# Patient Record
Sex: Female | Born: 1985 | Race: White | Marital: Married | Smoking: Never smoker
Health system: Southern US, Community
[De-identification: ages and names within clinical notes are randomized; demographics above are authoritative.]

## PROBLEM LIST (undated history)

## (undated) DIAGNOSIS — E039 Hypothyroidism, unspecified: Secondary | ICD-10-CM

## (undated) DIAGNOSIS — G8929 Other chronic pain: Secondary | ICD-10-CM

## (undated) HISTORY — PX: WISDOM TOOTH EXTRACTION: SHX21

## (undated) HISTORY — DX: Hypothyroidism, unspecified: E03.9

## (undated) HISTORY — PX: BACK SURGERY: SHX140

## (undated) HISTORY — DX: Other chronic pain: G89.29

---

## 2020-02-05 DIAGNOSIS — M9902 Segmental and somatic dysfunction of thoracic region: Secondary | ICD-10-CM | POA: Diagnosis not present

## 2020-02-05 DIAGNOSIS — M545 Low back pain, unspecified: Secondary | ICD-10-CM | POA: Diagnosis not present

## 2020-02-05 DIAGNOSIS — M62830 Muscle spasm of back: Secondary | ICD-10-CM | POA: Diagnosis not present

## 2020-02-05 DIAGNOSIS — M9903 Segmental and somatic dysfunction of lumbar region: Secondary | ICD-10-CM | POA: Diagnosis not present

## 2020-02-12 DIAGNOSIS — M549 Dorsalgia, unspecified: Secondary | ICD-10-CM | POA: Diagnosis not present

## 2020-02-12 DIAGNOSIS — M5416 Radiculopathy, lumbar region: Secondary | ICD-10-CM | POA: Diagnosis not present

## 2020-02-12 DIAGNOSIS — M25552 Pain in left hip: Secondary | ICD-10-CM | POA: Diagnosis not present

## 2020-02-19 DIAGNOSIS — M5416 Radiculopathy, lumbar region: Secondary | ICD-10-CM | POA: Diagnosis not present

## 2020-02-19 DIAGNOSIS — M62830 Muscle spasm of back: Secondary | ICD-10-CM | POA: Diagnosis not present

## 2020-02-19 DIAGNOSIS — M5459 Other low back pain: Secondary | ICD-10-CM | POA: Diagnosis not present

## 2020-02-26 DIAGNOSIS — M545 Low back pain, unspecified: Secondary | ICD-10-CM | POA: Diagnosis not present

## 2020-02-26 DIAGNOSIS — M62830 Muscle spasm of back: Secondary | ICD-10-CM | POA: Diagnosis not present

## 2020-02-26 DIAGNOSIS — M9903 Segmental and somatic dysfunction of lumbar region: Secondary | ICD-10-CM | POA: Diagnosis not present

## 2020-02-26 DIAGNOSIS — M9902 Segmental and somatic dysfunction of thoracic region: Secondary | ICD-10-CM | POA: Diagnosis not present

## 2020-03-11 DIAGNOSIS — M5416 Radiculopathy, lumbar region: Secondary | ICD-10-CM | POA: Diagnosis not present

## 2020-03-14 DIAGNOSIS — M5416 Radiculopathy, lumbar region: Secondary | ICD-10-CM | POA: Diagnosis not present

## 2020-03-25 DIAGNOSIS — M62830 Muscle spasm of back: Secondary | ICD-10-CM | POA: Diagnosis not present

## 2020-03-25 DIAGNOSIS — M545 Low back pain, unspecified: Secondary | ICD-10-CM | POA: Diagnosis not present

## 2020-03-25 DIAGNOSIS — M9902 Segmental and somatic dysfunction of thoracic region: Secondary | ICD-10-CM | POA: Diagnosis not present

## 2020-03-25 DIAGNOSIS — M9903 Segmental and somatic dysfunction of lumbar region: Secondary | ICD-10-CM | POA: Diagnosis not present

## 2020-04-07 DIAGNOSIS — Z6828 Body mass index (BMI) 28.0-28.9, adult: Secondary | ICD-10-CM | POA: Diagnosis not present

## 2020-04-07 DIAGNOSIS — M5126 Other intervertebral disc displacement, lumbar region: Secondary | ICD-10-CM | POA: Diagnosis not present

## 2020-04-07 DIAGNOSIS — M5416 Radiculopathy, lumbar region: Secondary | ICD-10-CM | POA: Diagnosis not present

## 2020-04-07 DIAGNOSIS — R030 Elevated blood-pressure reading, without diagnosis of hypertension: Secondary | ICD-10-CM | POA: Diagnosis not present

## 2020-04-15 DIAGNOSIS — M9902 Segmental and somatic dysfunction of thoracic region: Secondary | ICD-10-CM | POA: Diagnosis not present

## 2020-04-15 DIAGNOSIS — M9903 Segmental and somatic dysfunction of lumbar region: Secondary | ICD-10-CM | POA: Diagnosis not present

## 2020-04-15 DIAGNOSIS — M62830 Muscle spasm of back: Secondary | ICD-10-CM | POA: Diagnosis not present

## 2020-04-15 DIAGNOSIS — M545 Low back pain, unspecified: Secondary | ICD-10-CM | POA: Diagnosis not present

## 2020-04-30 DIAGNOSIS — M5136 Other intervertebral disc degeneration, lumbar region: Secondary | ICD-10-CM | POA: Diagnosis not present

## 2020-04-30 DIAGNOSIS — M4727 Other spondylosis with radiculopathy, lumbosacral region: Secondary | ICD-10-CM | POA: Diagnosis not present

## 2020-04-30 DIAGNOSIS — M5126 Other intervertebral disc displacement, lumbar region: Secondary | ICD-10-CM | POA: Diagnosis not present

## 2020-04-30 DIAGNOSIS — M5117 Intervertebral disc disorders with radiculopathy, lumbosacral region: Secondary | ICD-10-CM | POA: Diagnosis not present

## 2020-04-30 DIAGNOSIS — M47816 Spondylosis without myelopathy or radiculopathy, lumbar region: Secondary | ICD-10-CM | POA: Diagnosis not present

## 2020-05-13 DIAGNOSIS — R635 Abnormal weight gain: Secondary | ICD-10-CM | POA: Diagnosis not present

## 2020-05-13 DIAGNOSIS — F419 Anxiety disorder, unspecified: Secondary | ICD-10-CM | POA: Diagnosis not present

## 2020-05-13 DIAGNOSIS — Z1331 Encounter for screening for depression: Secondary | ICD-10-CM | POA: Diagnosis not present

## 2020-05-13 DIAGNOSIS — Z1339 Encounter for screening examination for other mental health and behavioral disorders: Secondary | ICD-10-CM | POA: Diagnosis not present

## 2020-05-13 DIAGNOSIS — Z6829 Body mass index (BMI) 29.0-29.9, adult: Secondary | ICD-10-CM | POA: Diagnosis not present

## 2020-05-13 DIAGNOSIS — M5416 Radiculopathy, lumbar region: Secondary | ICD-10-CM | POA: Diagnosis not present

## 2020-05-13 DIAGNOSIS — G4700 Insomnia, unspecified: Secondary | ICD-10-CM | POA: Diagnosis not present

## 2020-05-13 DIAGNOSIS — Z1389 Encounter for screening for other disorder: Secondary | ICD-10-CM | POA: Diagnosis not present

## 2020-05-13 DIAGNOSIS — Z0000 Encounter for general adult medical examination without abnormal findings: Secondary | ICD-10-CM | POA: Diagnosis not present

## 2020-05-13 DIAGNOSIS — F32A Depression, unspecified: Secondary | ICD-10-CM | POA: Diagnosis not present

## 2020-05-13 DIAGNOSIS — E559 Vitamin D deficiency, unspecified: Secondary | ICD-10-CM | POA: Diagnosis not present

## 2020-08-12 DIAGNOSIS — E039 Hypothyroidism, unspecified: Secondary | ICD-10-CM | POA: Diagnosis not present

## 2020-08-12 DIAGNOSIS — F419 Anxiety disorder, unspecified: Secondary | ICD-10-CM | POA: Diagnosis not present

## 2020-08-12 DIAGNOSIS — M5416 Radiculopathy, lumbar region: Secondary | ICD-10-CM | POA: Diagnosis not present

## 2020-08-12 DIAGNOSIS — M549 Dorsalgia, unspecified: Secondary | ICD-10-CM | POA: Diagnosis not present

## 2020-08-12 DIAGNOSIS — E559 Vitamin D deficiency, unspecified: Secondary | ICD-10-CM | POA: Diagnosis not present

## 2020-10-09 DIAGNOSIS — M79641 Pain in right hand: Secondary | ICD-10-CM | POA: Diagnosis not present

## 2020-12-31 DIAGNOSIS — M9903 Segmental and somatic dysfunction of lumbar region: Secondary | ICD-10-CM | POA: Diagnosis not present

## 2020-12-31 DIAGNOSIS — M62830 Muscle spasm of back: Secondary | ICD-10-CM | POA: Diagnosis not present

## 2020-12-31 DIAGNOSIS — M545 Low back pain, unspecified: Secondary | ICD-10-CM | POA: Diagnosis not present

## 2020-12-31 DIAGNOSIS — M9902 Segmental and somatic dysfunction of thoracic region: Secondary | ICD-10-CM | POA: Diagnosis not present

## 2021-01-06 DIAGNOSIS — M9903 Segmental and somatic dysfunction of lumbar region: Secondary | ICD-10-CM | POA: Diagnosis not present

## 2021-01-06 DIAGNOSIS — M545 Low back pain, unspecified: Secondary | ICD-10-CM | POA: Diagnosis not present

## 2021-01-06 DIAGNOSIS — M9902 Segmental and somatic dysfunction of thoracic region: Secondary | ICD-10-CM | POA: Diagnosis not present

## 2021-01-06 DIAGNOSIS — M62830 Muscle spasm of back: Secondary | ICD-10-CM | POA: Diagnosis not present

## 2021-01-13 DIAGNOSIS — M62830 Muscle spasm of back: Secondary | ICD-10-CM | POA: Diagnosis not present

## 2021-01-13 DIAGNOSIS — M9903 Segmental and somatic dysfunction of lumbar region: Secondary | ICD-10-CM | POA: Diagnosis not present

## 2021-01-13 DIAGNOSIS — M545 Low back pain, unspecified: Secondary | ICD-10-CM | POA: Diagnosis not present

## 2021-01-13 DIAGNOSIS — M9902 Segmental and somatic dysfunction of thoracic region: Secondary | ICD-10-CM | POA: Diagnosis not present

## 2021-01-20 DIAGNOSIS — M9903 Segmental and somatic dysfunction of lumbar region: Secondary | ICD-10-CM | POA: Diagnosis not present

## 2021-01-20 DIAGNOSIS — M9902 Segmental and somatic dysfunction of thoracic region: Secondary | ICD-10-CM | POA: Diagnosis not present

## 2021-01-20 DIAGNOSIS — M545 Low back pain, unspecified: Secondary | ICD-10-CM | POA: Diagnosis not present

## 2021-01-20 DIAGNOSIS — M62830 Muscle spasm of back: Secondary | ICD-10-CM | POA: Diagnosis not present

## 2021-01-27 DIAGNOSIS — M545 Low back pain, unspecified: Secondary | ICD-10-CM | POA: Diagnosis not present

## 2021-01-27 DIAGNOSIS — M9903 Segmental and somatic dysfunction of lumbar region: Secondary | ICD-10-CM | POA: Diagnosis not present

## 2021-01-27 DIAGNOSIS — M9902 Segmental and somatic dysfunction of thoracic region: Secondary | ICD-10-CM | POA: Diagnosis not present

## 2021-01-27 DIAGNOSIS — M62830 Muscle spasm of back: Secondary | ICD-10-CM | POA: Diagnosis not present

## 2021-02-10 DIAGNOSIS — M62830 Muscle spasm of back: Secondary | ICD-10-CM | POA: Diagnosis not present

## 2021-02-10 DIAGNOSIS — M9902 Segmental and somatic dysfunction of thoracic region: Secondary | ICD-10-CM | POA: Diagnosis not present

## 2021-02-10 DIAGNOSIS — M9903 Segmental and somatic dysfunction of lumbar region: Secondary | ICD-10-CM | POA: Diagnosis not present

## 2021-02-10 DIAGNOSIS — M545 Low back pain, unspecified: Secondary | ICD-10-CM | POA: Diagnosis not present

## 2021-02-16 DIAGNOSIS — E039 Hypothyroidism, unspecified: Secondary | ICD-10-CM | POA: Diagnosis not present

## 2021-02-17 DIAGNOSIS — M9902 Segmental and somatic dysfunction of thoracic region: Secondary | ICD-10-CM | POA: Diagnosis not present

## 2021-02-17 DIAGNOSIS — M62830 Muscle spasm of back: Secondary | ICD-10-CM | POA: Diagnosis not present

## 2021-02-17 DIAGNOSIS — M9903 Segmental and somatic dysfunction of lumbar region: Secondary | ICD-10-CM | POA: Diagnosis not present

## 2021-02-17 DIAGNOSIS — M545 Low back pain, unspecified: Secondary | ICD-10-CM | POA: Diagnosis not present

## 2021-02-24 DIAGNOSIS — M9902 Segmental and somatic dysfunction of thoracic region: Secondary | ICD-10-CM | POA: Diagnosis not present

## 2021-02-24 DIAGNOSIS — M545 Low back pain, unspecified: Secondary | ICD-10-CM | POA: Diagnosis not present

## 2021-02-24 DIAGNOSIS — M9903 Segmental and somatic dysfunction of lumbar region: Secondary | ICD-10-CM | POA: Diagnosis not present

## 2021-02-24 DIAGNOSIS — M62830 Muscle spasm of back: Secondary | ICD-10-CM | POA: Diagnosis not present

## 2021-03-03 DIAGNOSIS — M545 Low back pain, unspecified: Secondary | ICD-10-CM | POA: Diagnosis not present

## 2021-03-03 DIAGNOSIS — M9903 Segmental and somatic dysfunction of lumbar region: Secondary | ICD-10-CM | POA: Diagnosis not present

## 2021-03-03 DIAGNOSIS — M9902 Segmental and somatic dysfunction of thoracic region: Secondary | ICD-10-CM | POA: Diagnosis not present

## 2021-03-03 DIAGNOSIS — M62830 Muscle spasm of back: Secondary | ICD-10-CM | POA: Diagnosis not present

## 2021-03-10 DIAGNOSIS — M9903 Segmental and somatic dysfunction of lumbar region: Secondary | ICD-10-CM | POA: Diagnosis not present

## 2021-03-10 DIAGNOSIS — M9902 Segmental and somatic dysfunction of thoracic region: Secondary | ICD-10-CM | POA: Diagnosis not present

## 2021-03-10 DIAGNOSIS — M545 Low back pain, unspecified: Secondary | ICD-10-CM | POA: Diagnosis not present

## 2021-03-10 DIAGNOSIS — M62830 Muscle spasm of back: Secondary | ICD-10-CM | POA: Diagnosis not present

## 2021-03-17 DIAGNOSIS — M9903 Segmental and somatic dysfunction of lumbar region: Secondary | ICD-10-CM | POA: Diagnosis not present

## 2021-03-17 DIAGNOSIS — M9902 Segmental and somatic dysfunction of thoracic region: Secondary | ICD-10-CM | POA: Diagnosis not present

## 2021-03-17 DIAGNOSIS — M545 Low back pain, unspecified: Secondary | ICD-10-CM | POA: Diagnosis not present

## 2021-03-17 DIAGNOSIS — M62830 Muscle spasm of back: Secondary | ICD-10-CM | POA: Diagnosis not present

## 2021-03-19 ENCOUNTER — Other Ambulatory Visit: Payer: Self-pay | Admitting: Neurosurgery

## 2021-03-19 DIAGNOSIS — M5416 Radiculopathy, lumbar region: Secondary | ICD-10-CM

## 2021-03-24 DIAGNOSIS — M545 Low back pain, unspecified: Secondary | ICD-10-CM | POA: Diagnosis not present

## 2021-03-24 DIAGNOSIS — M9903 Segmental and somatic dysfunction of lumbar region: Secondary | ICD-10-CM | POA: Diagnosis not present

## 2021-03-24 DIAGNOSIS — M9902 Segmental and somatic dysfunction of thoracic region: Secondary | ICD-10-CM | POA: Diagnosis not present

## 2021-03-24 DIAGNOSIS — M62830 Muscle spasm of back: Secondary | ICD-10-CM | POA: Diagnosis not present

## 2021-03-30 DIAGNOSIS — R030 Elevated blood-pressure reading, without diagnosis of hypertension: Secondary | ICD-10-CM | POA: Diagnosis not present

## 2021-03-30 DIAGNOSIS — M5416 Radiculopathy, lumbar region: Secondary | ICD-10-CM | POA: Diagnosis not present

## 2021-04-03 ENCOUNTER — Ambulatory Visit
Admission: RE | Admit: 2021-04-03 | Discharge: 2021-04-03 | Disposition: A | Discharge status: Home / Self Care | Payer: Federal, State, Local not specified - PPO | Source: Ambulatory Visit | Attending: Neurosurgery | Admitting: Neurosurgery

## 2021-04-03 ENCOUNTER — Other Ambulatory Visit: Payer: Self-pay

## 2021-04-03 DIAGNOSIS — R600 Localized edema: Secondary | ICD-10-CM | POA: Diagnosis not present

## 2021-04-03 DIAGNOSIS — M5126 Other intervertebral disc displacement, lumbar region: Secondary | ICD-10-CM | POA: Diagnosis not present

## 2021-04-03 DIAGNOSIS — M47817 Spondylosis without myelopathy or radiculopathy, lumbosacral region: Secondary | ICD-10-CM | POA: Diagnosis not present

## 2021-04-03 DIAGNOSIS — M5416 Radiculopathy, lumbar region: Secondary | ICD-10-CM

## 2021-04-03 DIAGNOSIS — M4319 Spondylolisthesis, multiple sites in spine: Secondary | ICD-10-CM | POA: Diagnosis not present

## 2021-04-03 MED ORDER — GADOBENATE DIMEGLUMINE 529 MG/ML IV SOLN
15.0000 mL | Freq: Once | INTRAVENOUS | Status: AC | PRN
  Administered 2021-04-03: 15 mL via INTRAVENOUS

## 2021-04-08 ENCOUNTER — Other Ambulatory Visit: Payer: Self-pay

## 2021-04-09 DIAGNOSIS — M5416 Radiculopathy, lumbar region: Secondary | ICD-10-CM | POA: Diagnosis not present

## 2021-04-15 DIAGNOSIS — Z319 Encounter for procreative management, unspecified: Secondary | ICD-10-CM | POA: Diagnosis not present

## 2021-04-15 DIAGNOSIS — N979 Female infertility, unspecified: Secondary | ICD-10-CM | POA: Diagnosis not present

## 2021-04-15 DIAGNOSIS — Z3169 Encounter for other general counseling and advice on procreation: Secondary | ICD-10-CM | POA: Diagnosis not present

## 2021-04-24 DIAGNOSIS — N979 Female infertility, unspecified: Secondary | ICD-10-CM | POA: Diagnosis not present

## 2021-04-24 DIAGNOSIS — Z3202 Encounter for pregnancy test, result negative: Secondary | ICD-10-CM | POA: Diagnosis not present

## 2021-04-24 DIAGNOSIS — Z3169 Encounter for other general counseling and advice on procreation: Secondary | ICD-10-CM | POA: Diagnosis not present

## 2021-05-03 NOTE — L&D Delivery Note (Signed)
Delivery Note At 8:41 PM a viable and healthy female was delivered via Vaginal, Spontaneous (Presentation: Right Occiput Anterior).  APGAR: 8, 9; weight pending .   Placenta status: Spontaneous, Intact.  Cord: 3 vessels with the following complications: None.  Cord pH: N/A  Anesthesia: Epidural Episiotomy: None Lacerations: 2nd degree Suture Repair: 3.0 vicryl Est. Blood Loss (mL):  40 cc  Mom to postpartum.  Baby to Couplet care / Skin to Skin.  Jasmine Moreno [name] 01/27/2022, 9:13 PM

## 2021-05-12 DIAGNOSIS — M62830 Muscle spasm of back: Secondary | ICD-10-CM | POA: Diagnosis not present

## 2021-05-12 DIAGNOSIS — M545 Low back pain, unspecified: Secondary | ICD-10-CM | POA: Diagnosis not present

## 2021-05-12 DIAGNOSIS — M9903 Segmental and somatic dysfunction of lumbar region: Secondary | ICD-10-CM | POA: Diagnosis not present

## 2021-05-12 DIAGNOSIS — M9902 Segmental and somatic dysfunction of thoracic region: Secondary | ICD-10-CM | POA: Diagnosis not present

## 2021-05-26 DIAGNOSIS — M47816 Spondylosis without myelopathy or radiculopathy, lumbar region: Secondary | ICD-10-CM | POA: Diagnosis not present

## 2021-06-02 DIAGNOSIS — M545 Low back pain, unspecified: Secondary | ICD-10-CM | POA: Diagnosis not present

## 2021-06-02 DIAGNOSIS — M9903 Segmental and somatic dysfunction of lumbar region: Secondary | ICD-10-CM | POA: Diagnosis not present

## 2021-06-02 DIAGNOSIS — M9902 Segmental and somatic dysfunction of thoracic region: Secondary | ICD-10-CM | POA: Diagnosis not present

## 2021-06-02 DIAGNOSIS — M62830 Muscle spasm of back: Secondary | ICD-10-CM | POA: Diagnosis not present

## 2021-06-10 DIAGNOSIS — M47816 Spondylosis without myelopathy or radiculopathy, lumbar region: Secondary | ICD-10-CM | POA: Diagnosis not present

## 2021-06-18 DIAGNOSIS — M9902 Segmental and somatic dysfunction of thoracic region: Secondary | ICD-10-CM | POA: Diagnosis not present

## 2021-06-18 DIAGNOSIS — M62830 Muscle spasm of back: Secondary | ICD-10-CM | POA: Diagnosis not present

## 2021-06-18 DIAGNOSIS — M9903 Segmental and somatic dysfunction of lumbar region: Secondary | ICD-10-CM | POA: Diagnosis not present

## 2021-06-18 DIAGNOSIS — M545 Low back pain, unspecified: Secondary | ICD-10-CM | POA: Diagnosis not present

## 2021-07-03 DIAGNOSIS — Z3689 Encounter for other specified antenatal screening: Secondary | ICD-10-CM | POA: Diagnosis not present

## 2021-07-03 DIAGNOSIS — Z3200 Encounter for pregnancy test, result unknown: Secondary | ICD-10-CM | POA: Diagnosis not present

## 2021-07-08 DIAGNOSIS — Z3201 Encounter for pregnancy test, result positive: Secondary | ICD-10-CM | POA: Diagnosis not present

## 2021-07-16 DIAGNOSIS — M62830 Muscle spasm of back: Secondary | ICD-10-CM | POA: Diagnosis not present

## 2021-07-16 DIAGNOSIS — M9902 Segmental and somatic dysfunction of thoracic region: Secondary | ICD-10-CM | POA: Diagnosis not present

## 2021-07-16 DIAGNOSIS — M545 Low back pain, unspecified: Secondary | ICD-10-CM | POA: Diagnosis not present

## 2021-07-16 DIAGNOSIS — M9903 Segmental and somatic dysfunction of lumbar region: Secondary | ICD-10-CM | POA: Diagnosis not present

## 2021-07-24 DIAGNOSIS — O99280 Endocrine, nutritional and metabolic diseases complicating pregnancy, unspecified trimester: Secondary | ICD-10-CM | POA: Diagnosis not present

## 2021-07-24 DIAGNOSIS — Z3689 Encounter for other specified antenatal screening: Secondary | ICD-10-CM | POA: Diagnosis not present

## 2021-07-24 DIAGNOSIS — Z01419 Encounter for gynecological examination (general) (routine) without abnormal findings: Secondary | ICD-10-CM | POA: Diagnosis not present

## 2021-07-24 DIAGNOSIS — Z3A10 10 weeks gestation of pregnancy: Secondary | ICD-10-CM | POA: Diagnosis not present

## 2021-07-24 DIAGNOSIS — Z113 Encounter for screening for infections with a predominantly sexual mode of transmission: Secondary | ICD-10-CM | POA: Diagnosis not present

## 2021-07-24 DIAGNOSIS — O09511 Supervision of elderly primigravida, first trimester: Secondary | ICD-10-CM | POA: Diagnosis not present

## 2021-07-24 DIAGNOSIS — Z01411 Encounter for gynecological examination (general) (routine) with abnormal findings: Secondary | ICD-10-CM | POA: Diagnosis not present

## 2021-07-24 DIAGNOSIS — Z124 Encounter for screening for malignant neoplasm of cervix: Secondary | ICD-10-CM | POA: Diagnosis not present

## 2021-07-24 LAB — OB RESULTS CONSOLE RUBELLA ANTIBODY, IGM: Rubella: IMMUNE

## 2021-07-24 LAB — OB RESULTS CONSOLE HIV ANTIBODY (ROUTINE TESTING): HIV: NONREACTIVE

## 2021-07-24 LAB — OB RESULTS CONSOLE GC/CHLAMYDIA
Chlamydia: NEGATIVE
Neisseria Gonorrhea: NEGATIVE

## 2021-07-24 LAB — HEPATITIS C ANTIBODY: HCV Ab: NEGATIVE

## 2021-07-24 LAB — OB RESULTS CONSOLE RPR: RPR: NONREACTIVE

## 2021-07-24 LAB — OB RESULTS CONSOLE HEPATITIS B SURFACE ANTIGEN: Hepatitis B Surface Ag: NEGATIVE

## 2021-07-27 DIAGNOSIS — Z6824 Body mass index (BMI) 24.0-24.9, adult: Secondary | ICD-10-CM | POA: Diagnosis not present

## 2021-07-27 DIAGNOSIS — Z0000 Encounter for general adult medical examination without abnormal findings: Secondary | ICD-10-CM | POA: Diagnosis not present

## 2021-07-27 DIAGNOSIS — E039 Hypothyroidism, unspecified: Secondary | ICD-10-CM | POA: Diagnosis not present

## 2021-07-27 DIAGNOSIS — M5416 Radiculopathy, lumbar region: Secondary | ICD-10-CM | POA: Diagnosis not present

## 2021-08-13 DIAGNOSIS — M9903 Segmental and somatic dysfunction of lumbar region: Secondary | ICD-10-CM | POA: Diagnosis not present

## 2021-08-13 DIAGNOSIS — M9902 Segmental and somatic dysfunction of thoracic region: Secondary | ICD-10-CM | POA: Diagnosis not present

## 2021-08-13 DIAGNOSIS — M545 Low back pain, unspecified: Secondary | ICD-10-CM | POA: Diagnosis not present

## 2021-08-13 DIAGNOSIS — M62830 Muscle spasm of back: Secondary | ICD-10-CM | POA: Diagnosis not present

## 2021-09-17 DIAGNOSIS — M62830 Muscle spasm of back: Secondary | ICD-10-CM | POA: Diagnosis not present

## 2021-09-17 DIAGNOSIS — M9903 Segmental and somatic dysfunction of lumbar region: Secondary | ICD-10-CM | POA: Diagnosis not present

## 2021-09-17 DIAGNOSIS — M545 Low back pain, unspecified: Secondary | ICD-10-CM | POA: Diagnosis not present

## 2021-09-17 DIAGNOSIS — M9902 Segmental and somatic dysfunction of thoracic region: Secondary | ICD-10-CM | POA: Diagnosis not present

## 2021-09-25 DIAGNOSIS — O09511 Supervision of elderly primigravida, first trimester: Secondary | ICD-10-CM | POA: Diagnosis not present

## 2021-09-25 DIAGNOSIS — Z3689 Encounter for other specified antenatal screening: Secondary | ICD-10-CM | POA: Diagnosis not present

## 2021-09-25 DIAGNOSIS — Z3A19 19 weeks gestation of pregnancy: Secondary | ICD-10-CM | POA: Diagnosis not present

## 2021-09-25 DIAGNOSIS — Z361 Encounter for antenatal screening for raised alphafetoprotein level: Secondary | ICD-10-CM | POA: Diagnosis not present

## 2021-09-25 DIAGNOSIS — O09522 Supervision of elderly multigravida, second trimester: Secondary | ICD-10-CM | POA: Diagnosis not present

## 2021-09-25 DIAGNOSIS — Z3A16 16 weeks gestation of pregnancy: Secondary | ICD-10-CM | POA: Diagnosis not present

## 2021-09-25 DIAGNOSIS — O99280 Endocrine, nutritional and metabolic diseases complicating pregnancy, unspecified trimester: Secondary | ICD-10-CM | POA: Diagnosis not present

## 2021-10-15 DIAGNOSIS — M62830 Muscle spasm of back: Secondary | ICD-10-CM | POA: Diagnosis not present

## 2021-10-15 DIAGNOSIS — M9903 Segmental and somatic dysfunction of lumbar region: Secondary | ICD-10-CM | POA: Diagnosis not present

## 2021-10-15 DIAGNOSIS — M545 Low back pain, unspecified: Secondary | ICD-10-CM | POA: Diagnosis not present

## 2021-10-15 DIAGNOSIS — M9902 Segmental and somatic dysfunction of thoracic region: Secondary | ICD-10-CM | POA: Diagnosis not present

## 2021-11-19 DIAGNOSIS — M9902 Segmental and somatic dysfunction of thoracic region: Secondary | ICD-10-CM | POA: Diagnosis not present

## 2021-11-19 DIAGNOSIS — M9903 Segmental and somatic dysfunction of lumbar region: Secondary | ICD-10-CM | POA: Diagnosis not present

## 2021-11-19 DIAGNOSIS — M62830 Muscle spasm of back: Secondary | ICD-10-CM | POA: Diagnosis not present

## 2021-11-19 DIAGNOSIS — M545 Low back pain, unspecified: Secondary | ICD-10-CM | POA: Diagnosis not present

## 2021-11-20 DIAGNOSIS — O99280 Endocrine, nutritional and metabolic diseases complicating pregnancy, unspecified trimester: Secondary | ICD-10-CM | POA: Diagnosis not present

## 2021-11-20 DIAGNOSIS — Z3A27 27 weeks gestation of pregnancy: Secondary | ICD-10-CM | POA: Diagnosis not present

## 2021-11-20 DIAGNOSIS — Z3689 Encounter for other specified antenatal screening: Secondary | ICD-10-CM | POA: Diagnosis not present

## 2021-11-20 DIAGNOSIS — Z3493 Encounter for supervision of normal pregnancy, unspecified, third trimester: Secondary | ICD-10-CM | POA: Diagnosis not present

## 2021-11-20 LAB — OB RESULTS CONSOLE ABO/RH: RH Type: NEGATIVE

## 2021-11-20 LAB — OB RESULTS CONSOLE ANTIBODY SCREEN: Antibody Screen: NEGATIVE

## 2021-11-26 DIAGNOSIS — O360120 Maternal care for anti-D [Rh] antibodies, second trimester, not applicable or unspecified: Secondary | ICD-10-CM | POA: Diagnosis not present

## 2021-11-26 DIAGNOSIS — O99810 Abnormal glucose complicating pregnancy: Secondary | ICD-10-CM | POA: Diagnosis not present

## 2021-11-26 DIAGNOSIS — Z3A28 28 weeks gestation of pregnancy: Secondary | ICD-10-CM | POA: Diagnosis not present

## 2021-11-26 DIAGNOSIS — O360121 Maternal care for anti-D [Rh] antibodies, second trimester, fetus 1: Secondary | ICD-10-CM | POA: Diagnosis not present

## 2021-11-26 DIAGNOSIS — Z3689 Encounter for other specified antenatal screening: Secondary | ICD-10-CM | POA: Diagnosis not present

## 2021-12-04 DIAGNOSIS — O43123 Velamentous insertion of umbilical cord, third trimester: Secondary | ICD-10-CM | POA: Diagnosis not present

## 2021-12-04 DIAGNOSIS — Z3A29 29 weeks gestation of pregnancy: Secondary | ICD-10-CM | POA: Diagnosis not present

## 2021-12-09 DIAGNOSIS — O365930 Maternal care for other known or suspected poor fetal growth, third trimester, not applicable or unspecified: Secondary | ICD-10-CM | POA: Diagnosis not present

## 2021-12-09 DIAGNOSIS — Z3A30 30 weeks gestation of pregnancy: Secondary | ICD-10-CM | POA: Diagnosis not present

## 2021-12-09 DIAGNOSIS — O43123 Velamentous insertion of umbilical cord, third trimester: Secondary | ICD-10-CM | POA: Diagnosis not present

## 2021-12-09 DIAGNOSIS — O99283 Endocrine, nutritional and metabolic diseases complicating pregnancy, third trimester: Secondary | ICD-10-CM | POA: Diagnosis not present

## 2021-12-17 DIAGNOSIS — M9903 Segmental and somatic dysfunction of lumbar region: Secondary | ICD-10-CM | POA: Diagnosis not present

## 2021-12-17 DIAGNOSIS — M9902 Segmental and somatic dysfunction of thoracic region: Secondary | ICD-10-CM | POA: Diagnosis not present

## 2021-12-17 DIAGNOSIS — M545 Low back pain, unspecified: Secondary | ICD-10-CM | POA: Diagnosis not present

## 2021-12-17 DIAGNOSIS — M62830 Muscle spasm of back: Secondary | ICD-10-CM | POA: Diagnosis not present

## 2021-12-18 DIAGNOSIS — O43123 Velamentous insertion of umbilical cord, third trimester: Secondary | ICD-10-CM | POA: Diagnosis not present

## 2021-12-18 DIAGNOSIS — Z3A30 30 weeks gestation of pregnancy: Secondary | ICD-10-CM | POA: Diagnosis not present

## 2021-12-18 DIAGNOSIS — O365930 Maternal care for other known or suspected poor fetal growth, third trimester, not applicable or unspecified: Secondary | ICD-10-CM | POA: Diagnosis not present

## 2021-12-18 DIAGNOSIS — O99283 Endocrine, nutritional and metabolic diseases complicating pregnancy, third trimester: Secondary | ICD-10-CM | POA: Diagnosis not present

## 2021-12-18 DIAGNOSIS — Z3A31 31 weeks gestation of pregnancy: Secondary | ICD-10-CM | POA: Diagnosis not present

## 2021-12-22 DIAGNOSIS — O365930 Maternal care for other known or suspected poor fetal growth, third trimester, not applicable or unspecified: Secondary | ICD-10-CM | POA: Diagnosis not present

## 2021-12-22 DIAGNOSIS — Z3A32 32 weeks gestation of pregnancy: Secondary | ICD-10-CM | POA: Diagnosis not present

## 2021-12-25 DIAGNOSIS — O365930 Maternal care for other known or suspected poor fetal growth, third trimester, not applicable or unspecified: Secondary | ICD-10-CM | POA: Diagnosis not present

## 2021-12-25 DIAGNOSIS — O09513 Supervision of elderly primigravida, third trimester: Secondary | ICD-10-CM | POA: Diagnosis not present

## 2021-12-25 DIAGNOSIS — O99283 Endocrine, nutritional and metabolic diseases complicating pregnancy, third trimester: Secondary | ICD-10-CM | POA: Diagnosis not present

## 2021-12-25 DIAGNOSIS — O283 Abnormal ultrasonic finding on antenatal screening of mother: Secondary | ICD-10-CM | POA: Diagnosis not present

## 2021-12-25 DIAGNOSIS — Z3A32 32 weeks gestation of pregnancy: Secondary | ICD-10-CM | POA: Diagnosis not present

## 2021-12-25 DIAGNOSIS — O43123 Velamentous insertion of umbilical cord, third trimester: Secondary | ICD-10-CM | POA: Diagnosis not present

## 2021-12-29 ENCOUNTER — Other Ambulatory Visit: Payer: Self-pay | Admitting: Obstetrics and Gynecology

## 2021-12-29 DIAGNOSIS — Z363 Encounter for antenatal screening for malformations: Secondary | ICD-10-CM

## 2021-12-29 DIAGNOSIS — Z3A33 33 weeks gestation of pregnancy: Secondary | ICD-10-CM | POA: Diagnosis not present

## 2021-12-29 DIAGNOSIS — O365930 Maternal care for other known or suspected poor fetal growth, third trimester, not applicable or unspecified: Secondary | ICD-10-CM | POA: Diagnosis not present

## 2021-12-29 DIAGNOSIS — E039 Hypothyroidism, unspecified: Secondary | ICD-10-CM

## 2021-12-29 DIAGNOSIS — O09523 Supervision of elderly multigravida, third trimester: Secondary | ICD-10-CM

## 2022-01-01 ENCOUNTER — Encounter: Payer: Self-pay | Admitting: *Deleted

## 2022-01-01 DIAGNOSIS — O365930 Maternal care for other known or suspected poor fetal growth, third trimester, not applicable or unspecified: Secondary | ICD-10-CM | POA: Diagnosis not present

## 2022-01-05 ENCOUNTER — Ambulatory Visit: Payer: Federal, State, Local not specified - PPO | Admitting: *Deleted

## 2022-01-05 ENCOUNTER — Other Ambulatory Visit: Payer: Self-pay | Admitting: Obstetrics and Gynecology

## 2022-01-05 ENCOUNTER — Encounter: Payer: Self-pay | Admitting: *Deleted

## 2022-01-05 ENCOUNTER — Ambulatory Visit (HOSPITAL_BASED_OUTPATIENT_CLINIC_OR_DEPARTMENT_OTHER): Payer: Federal, State, Local not specified - PPO | Admitting: Obstetrics

## 2022-01-05 ENCOUNTER — Ambulatory Visit: Payer: Federal, State, Local not specified - PPO | Attending: Obstetrics and Gynecology

## 2022-01-05 DIAGNOSIS — O365930 Maternal care for other known or suspected poor fetal growth, third trimester, not applicable or unspecified: Secondary | ICD-10-CM

## 2022-01-05 DIAGNOSIS — O99283 Endocrine, nutritional and metabolic diseases complicating pregnancy, third trimester: Secondary | ICD-10-CM | POA: Insufficient documentation

## 2022-01-05 DIAGNOSIS — Z363 Encounter for antenatal screening for malformations: Secondary | ICD-10-CM | POA: Diagnosis not present

## 2022-01-05 DIAGNOSIS — E039 Hypothyroidism, unspecified: Secondary | ICD-10-CM

## 2022-01-05 DIAGNOSIS — O09523 Supervision of elderly multigravida, third trimester: Secondary | ICD-10-CM | POA: Diagnosis not present

## 2022-01-05 DIAGNOSIS — Z3A34 34 weeks gestation of pregnancy: Secondary | ICD-10-CM

## 2022-01-05 DIAGNOSIS — O365990 Maternal care for other known or suspected poor fetal growth, unspecified trimester, not applicable or unspecified: Secondary | ICD-10-CM

## 2022-01-05 DIAGNOSIS — O09513 Supervision of elderly primigravida, third trimester: Secondary | ICD-10-CM | POA: Insufficient documentation

## 2022-01-05 NOTE — Procedures (Signed)
Jasmine Moreno [birth year] [redacted]w[redacted]d  Fetus A Non-Stress Test Interpretation for 01/05/22  Indication: IUGR  Fetal Heart Rate A Mode: External Baseline Rate (A): 130 bpm Variability: Moderate Accelerations: 15 x 15 Decelerations: None Multiple birth?: No  Uterine Activity Mode: Palpation, Toco Contraction Frequency (min): none Resting Tone Palpated: Relaxed  Interpretation (Fetal Testing) Nonstress Test Interpretation: Reactive Overall Impression: Reassuring for gestational age Comments: Dr. Parke Poisson reviewed tracing

## 2022-01-06 NOTE — Progress Notes (Signed)
MFM Note  Jasmine Moreno was seen due to IUGR noted on ultrasound exams performed in your office.  Her pregnancy has also been complicated by advanced maternal age and hypothyroidism treated with levothyroxine.  She is undergoing twice-weekly fetal testing in your office.    She has screened negative for gestational diabetes and denies any significant past medical history.  She had a cell free DNA test drawn earlier in her pregnancy which indicated a low risk for trisomy 48, 55, and 13.  A female fetus is predicted.    On today's exam, the EFW 3 pounds 10 ounces (1655 g) measures at the < 1  percentile for her gestational age indicating severe IUGR.    The total AFI was 11.68 cm (within normal limits).  A BPP performed today was 8 out of 10 with a reactive NST.  She received a -2 for fetal breathing movements that did not meet criteria.  Doppler studies of the umbilical arteries showed a normal S/D ratio of 2.37. There were no signs of absent or reversed end-diastolic flow.    The views of the fetal anatomy were limited today due to her advanced gestational age.  The implications and management of IUGR was discussed with the patient and her husband.  Due to IUGR, she should continue weekly umbilical artery Doppler studies and twice weekly fetal testing using either BPP's or NSTs in your office.  They were advised that due to IUGR, delivery is recommended at between 37 to 38 weeks.    The patient stated that she would prefer to delay delivery until early October.    She was advised that this would be reasonable as long as the weekly umbilical artery Doppler studies and fetal testing remain within normal limits.    She understands that delivery prior to 38 weeks may be necessary should her umbilical artery Doppler studies show absent or reversed end-diastolic flow.  The increased risk of a NICU admission after delivery due to IUGR was discussed.  No further exams were scheduled in our office  today as the patient reports that she will have the weekly umbilical artery Doppler studies and weekly fetal testing performed in your office.    The couple stated that all of their questions were answered to their satisfaction today.  A total of 30 minutes was spent counseling and coordinating the care for this patient.  Greater than 50% of the time was spent in direct face-to-face contact.

## 2022-01-08 DIAGNOSIS — O09513 Supervision of elderly primigravida, third trimester: Secondary | ICD-10-CM | POA: Diagnosis not present

## 2022-01-08 DIAGNOSIS — O99283 Endocrine, nutritional and metabolic diseases complicating pregnancy, third trimester: Secondary | ICD-10-CM | POA: Diagnosis not present

## 2022-01-08 DIAGNOSIS — Z3A34 34 weeks gestation of pregnancy: Secondary | ICD-10-CM | POA: Diagnosis not present

## 2022-01-11 DIAGNOSIS — O365930 Maternal care for other known or suspected poor fetal growth, third trimester, not applicable or unspecified: Secondary | ICD-10-CM | POA: Diagnosis not present

## 2022-01-11 DIAGNOSIS — O43123 Velamentous insertion of umbilical cord, third trimester: Secondary | ICD-10-CM | POA: Diagnosis not present

## 2022-01-11 DIAGNOSIS — O99283 Endocrine, nutritional and metabolic diseases complicating pregnancy, third trimester: Secondary | ICD-10-CM | POA: Diagnosis not present

## 2022-01-11 DIAGNOSIS — Z3A35 35 weeks gestation of pregnancy: Secondary | ICD-10-CM | POA: Diagnosis not present

## 2022-01-11 DIAGNOSIS — Z3685 Encounter for antenatal screening for Streptococcus B: Secondary | ICD-10-CM | POA: Diagnosis not present

## 2022-01-14 DIAGNOSIS — M9902 Segmental and somatic dysfunction of thoracic region: Secondary | ICD-10-CM | POA: Diagnosis not present

## 2022-01-14 DIAGNOSIS — R030 Elevated blood-pressure reading, without diagnosis of hypertension: Secondary | ICD-10-CM | POA: Diagnosis not present

## 2022-01-14 DIAGNOSIS — O99283 Endocrine, nutritional and metabolic diseases complicating pregnancy, third trimester: Secondary | ICD-10-CM | POA: Diagnosis not present

## 2022-01-14 DIAGNOSIS — M62830 Muscle spasm of back: Secondary | ICD-10-CM | POA: Diagnosis not present

## 2022-01-14 DIAGNOSIS — O26893 Other specified pregnancy related conditions, third trimester: Secondary | ICD-10-CM | POA: Diagnosis not present

## 2022-01-14 DIAGNOSIS — M9903 Segmental and somatic dysfunction of lumbar region: Secondary | ICD-10-CM | POA: Diagnosis not present

## 2022-01-14 DIAGNOSIS — O365930 Maternal care for other known or suspected poor fetal growth, third trimester, not applicable or unspecified: Secondary | ICD-10-CM | POA: Diagnosis not present

## 2022-01-14 DIAGNOSIS — Z3A35 35 weeks gestation of pregnancy: Secondary | ICD-10-CM | POA: Diagnosis not present

## 2022-01-14 DIAGNOSIS — M545 Low back pain, unspecified: Secondary | ICD-10-CM | POA: Diagnosis not present

## 2022-01-14 DIAGNOSIS — O43123 Velamentous insertion of umbilical cord, third trimester: Secondary | ICD-10-CM | POA: Diagnosis not present

## 2022-01-19 DIAGNOSIS — Z3A36 36 weeks gestation of pregnancy: Secondary | ICD-10-CM | POA: Diagnosis not present

## 2022-01-19 DIAGNOSIS — O43123 Velamentous insertion of umbilical cord, third trimester: Secondary | ICD-10-CM | POA: Diagnosis not present

## 2022-01-19 DIAGNOSIS — M5489 Other dorsalgia: Secondary | ICD-10-CM | POA: Diagnosis not present

## 2022-01-19 DIAGNOSIS — O133 Gestational [pregnancy-induced] hypertension without significant proteinuria, third trimester: Secondary | ICD-10-CM | POA: Diagnosis not present

## 2022-01-19 DIAGNOSIS — O99283 Endocrine, nutritional and metabolic diseases complicating pregnancy, third trimester: Secondary | ICD-10-CM | POA: Diagnosis not present

## 2022-01-19 DIAGNOSIS — O365930 Maternal care for other known or suspected poor fetal growth, third trimester, not applicable or unspecified: Secondary | ICD-10-CM | POA: Diagnosis not present

## 2022-01-19 LAB — OB RESULTS CONSOLE GBS: GBS: NEGATIVE

## 2022-01-20 ENCOUNTER — Telehealth (HOSPITAL_COMMUNITY): Payer: Self-pay | Admitting: *Deleted

## 2022-01-20 NOTE — Telephone Encounter (Signed)
Preadmission screen  

## 2022-01-21 ENCOUNTER — Telehealth (HOSPITAL_COMMUNITY): Payer: Self-pay | Admitting: *Deleted

## 2022-01-21 ENCOUNTER — Encounter (HOSPITAL_COMMUNITY): Payer: Self-pay | Admitting: *Deleted

## 2022-01-21 NOTE — Telephone Encounter (Signed)
Preadmission screen  

## 2022-01-22 DIAGNOSIS — O365930 Maternal care for other known or suspected poor fetal growth, third trimester, not applicable or unspecified: Secondary | ICD-10-CM | POA: Diagnosis not present

## 2022-01-22 DIAGNOSIS — Z3A36 36 weeks gestation of pregnancy: Secondary | ICD-10-CM | POA: Diagnosis not present

## 2022-01-26 ENCOUNTER — Other Ambulatory Visit: Payer: Self-pay | Admitting: Obstetrics and Gynecology

## 2022-01-26 DIAGNOSIS — O365930 Maternal care for other known or suspected poor fetal growth, third trimester, not applicable or unspecified: Secondary | ICD-10-CM | POA: Diagnosis not present

## 2022-01-26 DIAGNOSIS — Z3A37 37 weeks gestation of pregnancy: Secondary | ICD-10-CM | POA: Diagnosis not present

## 2022-01-27 ENCOUNTER — Encounter (HOSPITAL_COMMUNITY): Payer: Self-pay | Admitting: Obstetrics and Gynecology

## 2022-01-27 ENCOUNTER — Inpatient Hospital Stay (HOSPITAL_COMMUNITY)
Admission: AD | Admit: 2022-01-27 | Discharge: 2022-01-29 | DRG: 807 | Disposition: A | Discharge status: Home / Self Care | Payer: Federal, State, Local not specified - PPO | Attending: Obstetrics and Gynecology | Admitting: Obstetrics and Gynecology

## 2022-01-27 ENCOUNTER — Inpatient Hospital Stay (HOSPITAL_COMMUNITY): Payer: Federal, State, Local not specified - PPO

## 2022-01-27 ENCOUNTER — Inpatient Hospital Stay (HOSPITAL_COMMUNITY): Payer: Federal, State, Local not specified - PPO | Admitting: Anesthesiology

## 2022-01-27 ENCOUNTER — Other Ambulatory Visit: Payer: Self-pay

## 2022-01-27 DIAGNOSIS — E039 Hypothyroidism, unspecified: Secondary | ICD-10-CM | POA: Diagnosis present

## 2022-01-27 DIAGNOSIS — O43123 Velamentous insertion of umbilical cord, third trimester: Secondary | ICD-10-CM | POA: Diagnosis not present

## 2022-01-27 DIAGNOSIS — O365930 Maternal care for other known or suspected poor fetal growth, third trimester, not applicable or unspecified: Principal | ICD-10-CM | POA: Diagnosis present

## 2022-01-27 DIAGNOSIS — Z3A37 37 weeks gestation of pregnancy: Secondary | ICD-10-CM | POA: Diagnosis not present

## 2022-01-27 DIAGNOSIS — Z6791 Unspecified blood type, Rh negative: Secondary | ICD-10-CM | POA: Diagnosis not present

## 2022-01-27 DIAGNOSIS — O26893 Other specified pregnancy related conditions, third trimester: Secondary | ICD-10-CM | POA: Diagnosis present

## 2022-01-27 DIAGNOSIS — O99284 Endocrine, nutritional and metabolic diseases complicating childbirth: Secondary | ICD-10-CM | POA: Diagnosis not present

## 2022-01-27 DIAGNOSIS — Z3490 Encounter for supervision of normal pregnancy, unspecified, unspecified trimester: Secondary | ICD-10-CM | POA: Diagnosis present

## 2022-01-27 LAB — CBC
HCT: 35.4 % — ABNORMAL LOW (ref 36.0–46.0)
Hemoglobin: 12.6 g/dL (ref 12.0–15.0)
MCH: 34.2 pg — ABNORMAL HIGH (ref 26.0–34.0)
MCHC: 35.6 g/dL (ref 30.0–36.0)
MCV: 96.2 fL (ref 80.0–100.0)
Platelets: 243 10*3/uL (ref 150–400)
RBC: 3.68 MIL/uL — ABNORMAL LOW (ref 3.87–5.11)
RDW: 11.9 % (ref 11.5–15.5)
WBC: 10.1 10*3/uL (ref 4.0–10.5)
nRBC: 0 % (ref 0.0–0.2)

## 2022-01-27 LAB — RPR: RPR Ser Ql: NONREACTIVE

## 2022-01-27 LAB — TYPE AND SCREEN
ABO/RH(D): O NEG
Antibody Screen: POSITIVE

## 2022-01-27 MED ORDER — ONDANSETRON HCL 4 MG PO TABS: 4.0000 mg | ORAL_TABLET | ORAL | Status: DC | PRN

## 2022-01-27 MED ORDER — OXYTOCIN-SODIUM CHLORIDE 30-0.9 UT/500ML-% IV SOLN
1.0000 m[IU]/min | INTRAVENOUS | Status: DC
  Administered 2022-01-27: 2 m[IU]/min via INTRAVENOUS
  Filled 2022-01-27: qty 500

## 2022-01-27 MED ORDER — SOD CITRATE-CITRIC ACID 500-334 MG/5ML PO SOLN: 30.0000 mL | ORAL | Status: DC | PRN

## 2022-01-27 MED ORDER — MISOPROSTOL 25 MCG QUARTER TABLET
25.0000 ug | ORAL_TABLET | Freq: Once | ORAL | Status: DC
  Filled 2022-01-27: qty 1

## 2022-01-27 MED ORDER — ACETAMINOPHEN 325 MG PO TABS
650.0000 mg | ORAL_TABLET | ORAL | Status: DC | PRN
  Administered 2022-01-28 – 2022-01-29 (×6): 650 mg via ORAL
  Filled 2022-01-27 (×6): qty 2

## 2022-01-27 MED ORDER — TETANUS-DIPHTH-ACELL PERTUSSIS 5-2.5-18.5 LF-MCG/0.5 IM SUSY: 0.5000 mL | PREFILLED_SYRINGE | Freq: Once | INTRAMUSCULAR | Status: DC

## 2022-01-27 MED ORDER — LEVOTHYROXINE SODIUM 75 MCG PO TABS
75.0000 ug | ORAL_TABLET | Freq: Every day | ORAL | Status: DC
  Administered 2022-01-28 – 2022-01-29 (×2): 75 ug via ORAL
  Filled 2022-01-27 (×2): qty 1

## 2022-01-27 MED ORDER — ZOLPIDEM TARTRATE 5 MG PO TABS: 5.0000 mg | ORAL_TABLET | Freq: Every evening | ORAL | Status: DC | PRN

## 2022-01-27 MED ORDER — LACTATED RINGERS IV SOLN: 500.0000 mL | INTRAVENOUS | Status: DC | PRN

## 2022-01-27 MED ORDER — WITCH HAZEL-GLYCERIN EX PADS: 1.0000 | MEDICATED_PAD | CUTANEOUS | Status: DC | PRN

## 2022-01-27 MED ORDER — SIMETHICONE 80 MG PO CHEW: 80.0000 mg | CHEWABLE_TABLET | ORAL | Status: DC | PRN

## 2022-01-27 MED ORDER — LACTATED RINGERS IV SOLN: 500.0000 mL | Freq: Once | INTRAVENOUS | Status: DC

## 2022-01-27 MED ORDER — PHENYLEPHRINE 80 MCG/ML (10ML) SYRINGE FOR IV PUSH (FOR BLOOD PRESSURE SUPPORT): 80.0000 ug | PREFILLED_SYRINGE | INTRAVENOUS | Status: DC | PRN

## 2022-01-27 MED ORDER — MISOPROSTOL 25 MCG QUARTER TABLET
25.0000 ug | ORAL_TABLET | ORAL | Status: DC
  Administered 2022-01-27: 25 ug via VAGINAL

## 2022-01-27 MED ORDER — TERBUTALINE SULFATE 1 MG/ML IJ SOLN: 0.2500 mg | Freq: Once | INTRAMUSCULAR | Status: DC | PRN

## 2022-01-27 MED ORDER — OXYTOCIN BOLUS FROM INFUSION
333.0000 mL | Freq: Once | INTRAVENOUS | Status: AC
  Administered 2022-01-27: 333 mL via INTRAVENOUS

## 2022-01-27 MED ORDER — DIPHENHYDRAMINE HCL 50 MG/ML IJ SOLN: 12.5000 mg | INTRAMUSCULAR | Status: DC | PRN

## 2022-01-27 MED ORDER — LIDOCAINE HCL (PF) 1 % IJ SOLN: 30.0000 mL | INTRAMUSCULAR | Status: DC | PRN

## 2022-01-27 MED ORDER — DIBUCAINE (PERIANAL) 1 % EX OINT: 1.0000 | TOPICAL_OINTMENT | CUTANEOUS | Status: DC | PRN

## 2022-01-27 MED ORDER — EPHEDRINE 5 MG/ML INJ: 10.0000 mg | INTRAVENOUS | Status: DC | PRN

## 2022-01-27 MED ORDER — IBUPROFEN 600 MG PO TABS
600.0000 mg | ORAL_TABLET | Freq: Four times a day (QID) | ORAL | Status: DC
  Administered 2022-01-27 – 2022-01-29 (×7): 600 mg via ORAL
  Filled 2022-01-27 (×7): qty 1

## 2022-01-27 MED ORDER — ONDANSETRON HCL 4 MG/2ML IJ SOLN: 4.0000 mg | INTRAMUSCULAR | Status: DC | PRN

## 2022-01-27 MED ORDER — COCONUT OIL OIL: 1.0000 | TOPICAL_OIL | Status: DC | PRN

## 2022-01-27 MED ORDER — OXYTOCIN-SODIUM CHLORIDE 30-0.9 UT/500ML-% IV SOLN: 2.5000 [IU]/h | INTRAVENOUS | Status: DC

## 2022-01-27 MED ORDER — LACTATED RINGERS IV SOLN: INTRAVENOUS | Status: DC

## 2022-01-27 MED ORDER — ALPRAZOLAM 0.5 MG PO TABS
0.2500 mg | ORAL_TABLET | Freq: Once | ORAL | Status: AC
  Administered 2022-01-27: 0.25 mg via ORAL
  Filled 2022-01-27: qty 1

## 2022-01-27 MED ORDER — PRENATAL MULTIVITAMIN CH
1.0000 | ORAL_TABLET | Freq: Every day | ORAL | Status: DC
  Administered 2022-01-28 – 2022-01-29 (×2): 1 via ORAL
  Filled 2022-01-27 (×2): qty 1

## 2022-01-27 MED ORDER — DIPHENHYDRAMINE HCL 25 MG PO CAPS: 25.0000 mg | ORAL_CAPSULE | Freq: Four times a day (QID) | ORAL | Status: DC | PRN

## 2022-01-27 MED ORDER — ONDANSETRON HCL 4 MG/2ML IJ SOLN: 4.0000 mg | Freq: Four times a day (QID) | INTRAMUSCULAR | Status: DC | PRN

## 2022-01-27 MED ORDER — FENTANYL-BUPIVACAINE-NACL 0.5-0.125-0.9 MG/250ML-% EP SOLN
12.0000 mL/h | EPIDURAL | Status: DC | PRN
  Administered 2022-01-27: 12 mL/h via EPIDURAL
  Filled 2022-01-27: qty 250

## 2022-01-27 MED ORDER — ACETAMINOPHEN 325 MG PO TABS
650.0000 mg | ORAL_TABLET | ORAL | Status: DC | PRN
  Administered 2022-01-27: 650 mg via ORAL
  Filled 2022-01-27: qty 2

## 2022-01-27 MED ORDER — LIDOCAINE HCL (PF) 1 % IJ SOLN
INTRAMUSCULAR | Status: DC | PRN
  Administered 2022-01-27: 6 mL via EPIDURAL
  Administered 2022-01-27: 4 mL via EPIDURAL

## 2022-01-27 MED ORDER — BENZOCAINE-MENTHOL 20-0.5 % EX AERO
1.0000 | INHALATION_SPRAY | CUTANEOUS | Status: DC | PRN
  Administered 2022-01-27: 1 via TOPICAL
  Filled 2022-01-27 (×2): qty 56

## 2022-01-27 MED ORDER — SENNOSIDES-DOCUSATE SODIUM 8.6-50 MG PO TABS
2.0000 | ORAL_TABLET | Freq: Every day | ORAL | Status: DC
  Administered 2022-01-28 – 2022-01-29 (×2): 2 via ORAL
  Filled 2022-01-27 (×2): qty 2

## 2022-01-27 NOTE — Progress Notes (Signed)
Called by nurse to review FHT 4 hr since last cytotec dose Wanted to inform of a prolonged deceleration about 10:50am, back to baseline with good variability but wondering if should do pitocin vs second cytotec; pt states that [place] wasn't low, it was just that she moved and was trying to get baby back on doppler Asked nurse to check pt and then call back while I review strip   FHT: 120s, nml variability, +accels, 6 min prolonged decel noted to 60s, then 90s, then 60s and then back to baseline; subsequent accels since and no further decels TOCO: irregular  Nurse returned call: Cx exam 2/70/-2  A/P: severe iugr at 37.4 wga Will go ahead and begin pitocin 2x2, will watch FHT closely though overall reassuring; ?cord compression with position change; follow closely

## 2022-01-27 NOTE — Anesthesia Procedure Notes (Signed)
Epidural Patient location during procedure: OB Start time: 01/27/2022 7:38 PM End time: 01/27/2022 7:49 PM  Staffing Anesthesiologist: Lidia Collum, MD Performed: anesthesiologist   Preanesthetic Checklist Completed: patient identified, IV checked, risks and benefits discussed, monitors and equipment checked, pre-op evaluation and timeout performed  Epidural Patient position: sitting Prep: DuraPrep Patient monitoring: heart rate, continuous pulse ox and blood pressure Approach: midline Location: L3-L4 Injection technique: LOR air  Needle:  Needle type: Tuohy  Needle gauge: 17 G Needle length: 9 cm Needle insertion depth: 6 cm Catheter type: closed end flexible Catheter size: 19 Gauge Catheter at skin depth: 11 cm Test dose: negative  Assessment Events: blood not aspirated, injection not painful, no injection resistance, no paresthesia and negative IV test  Additional Notes Reason for block:procedure for pain

## 2022-01-27 NOTE — H&P (Signed)
Jasmine Moreno is a 36 y.o. female G1P0 [redacted]w[redacted]d presenting for IOL for severe FGR, velamentous cord, and AMA.  Pregnancy is dated by LMP c/w 8w sono. She received prenatal care at [facility] with myself as primary. Routine prenatal labs were WNL. She had a low risk NIPT and negative AFP1 screen. Fetal anatomy scan significant for isolated EIF and velamentous placenta cord insertion, for which she had follow up growth US done in third trimester. FGR noted at 29.6 with EFW 7% and follow up at 32.6 EFW dropped to 3%. She did have MFM consultation on 9/5 which showed severe FGR with EFW <1%. She was followed closely with twice weekly antenatal testing including UAD which remained WNL and reassuring. Most recent growth scan done last week at 36.6 showed vertex presentation with EFW 4#13 (2%) with HC 20%, AC 1% and normal fluid and UAD. Patient did have elevated 1hr GTT but normal 3hr GTT. She has also had intermittent elevated BP in office but return to normal range on repeat. Has also checked BP at home with normal ranges and asymptomatic throughout. PMH significant for hypothyroidism on levothyroxine with normal TFT throughout the pregnancy.    OB History     Gravida  1   Para      Term      Preterm      AB      Living         SAB      IAB      Ectopic      Multiple      Live Births             Past Medical History:  Diagnosis Date   Chronic back pain    Hypothyroidism    Past Surgical History:  Procedure Laterality Date   BACK SURGERY     WISDOM TOOTH EXTRACTION     Family History: family history is not on file. Social History:  reports that she has never smoked. She has never used smokeless tobacco. She reports that she does not use drugs. No history on file for alcohol use.     Maternal Diabetes: No Genetic Screening: Normal Maternal Ultrasounds/Referrals: Isolated EIF (echogenic intracardiac focus) Fetal Ultrasounds or other Referrals:  MFM  01/05/22 Maternal Substance Abuse:  No Significant Maternal Medications:  Meds include: Syntroid Significant Maternal Lab Results:  Group B Strep negative Other Comments:  None  Review of Systems  All other systems reviewed and are negative.  Per HPI Maternal Exam:  Uterine Assessment: Contraction frequency is rare.  Abdomen: Patient reports no abdominal tenderness. Estimated fetal weight is 5#.   Fetal presentation: vertex   Fetal Exam Fetal Monitor Review: Baseline rate: 130.  Variability: moderate (6-25 bpm).   Pattern: accelerations present and no decelerations.   Fetal State Assessment: Category I - tracings are normal.   Physical Exam Vitals reviewed.  Constitutional:      Appearance: Normal appearance. She is normal weight.  Cardiovascular:     Rate and Rhythm: Normal rate.     Pulses: Normal pulses.  Pulmonary:     Effort: Pulmonary effort is normal.  Abdominal:     Tenderness: There is no abdominal tenderness.  Musculoskeletal:        General: Normal range of motion.     Cervical back: Normal range of motion.  Skin:    General: Skin is warm and dry.  Neurological:     General: No focal deficit present.  Mental Status: She is alert and oriented to person, place, and time.  Psychiatric:        Mood and Affect: Mood normal.        Behavior: Behavior normal.       Blood pressure 133/87, pulse 68, temperature 98.4 F (36.9 C), temperature source Oral, resp. rate 18, height 5\' 8"  (1.727 m), weight 86.2 kg, last menstrual period 05/09/2021.  Prenatal labs: ABO, Rh:  --/--/PENDING (09/27 4944) Antibody: PENDING (09/27 9675) Rubella: Immune (03/24 0000) RPR: Nonreactive (03/24 0000)  HBsAg: Negative (03/24 0000)  HIV: Non-reactive (03/24 0000)  GBS: Negative/-- (09/19 0000)   ChemistryNo results for input(s): "NA", "K", "CL", "CO2", "GLUCOSE", "BUN", "CREATININE", "CALCIUM", "GFRNONAA", "GFRAA", "ANIONGAP" in the last 168 hours.  No results for input(s):  "PROT", "ALBUMIN", "AST", "ALT", "ALKPHOS", "BILITOT" in the last 168 hours. Hematology Recent Labs  Lab 01/27/22 0714  WBC 10.1  RBC 3.68*  HGB 12.6  HCT 35.4*  MCV 96.2  MCH 34.2*  MCHC 35.6  RDW 11.9  PLT 243   Cardiac EnzymesNo results for input(s): "TROPONINI" in the last 168 hours. No results for input(s): "TROPIPOC" in the last 168 hours.  BNPNo results for input(s): "BNP", "PROBNP" in the last 168 hours.  DDimer No results for input(s): "DDIMER" in the last 168 hours.   Assessment/Plan: Jasmine Moreno is a 36 y.o. female G1P0 [redacted]w[redacted]d admitted for IOL for severe FGR.   -Admit to LD -Routine admission labs -FGR: EFW 2% reassuring fetal testing -Velamentous PCI- continuous fetal monitoring -IOL: plan for vaginal cytotec 41mcg q4hr PRN cervical ripening, discussed possible balloon, pitocin, and AROM -Cont EFM/Toco -Pt may have IV pain meds/Epidural on request -GBS NEG -AMA: LR NIPS, isolated EIF -Hypothyroid: cont Levo 88mcg daily -RH NEG s/p RhoGam 7/27, PP ABORh -Rubella Imm -Routine intrapartum care -Anticipate SVD  Tysean Vandervliet A Azyriah Nevins 01/27/2022, 8:49 AM

## 2022-01-27 NOTE — Lactation Note (Signed)
This note was copied from a baby's chart. Lactation Consultation Note  Patient Name: Jasmine Moreno [id number] Date: 01/27/2022 Reason for consult: L&D Initial assessment;Primapara;Early term 37-38.6wks Age:37 hours Assisted in latching baby. Baby has narrow flange, shallow latch, may suckle one. Will get baby opened wide then closes flange to just on nipple. Baby not super interested in BF at this time. Will f/u on MBU. Mom has great everted nipples. Mom excited about seeing colostrum.  Maternal Data Has patient been taught Hand Expression?: Yes Does the patient have breastfeeding experience prior to this delivery?: No  Feeding    LATCH Score Latch: Repeated attempts needed to sustain latch, nipple held in mouth throughout feeding, stimulation needed to elicit sucking reflex.  Audible Swallowing: None  Type of Nipple: Everted at rest and after stimulation  Comfort (Breast/Nipple): Soft / non-tender  Hold (Positioning): Full assist, staff holds infant at breast  LATCH Score: 5   Lactation Tools Discussed/Used    Interventions Interventions: Adjust position;Assisted with latch;Support pillows;Skin to skin;Position options;Breast massage;Hand express;Breast compression  Discharge    Consult Status Consult Status: Follow-up from L&D Date: 01/28/22 Follow-up type: In-patient    Theodoro Kalata 01/27/2022, 9:33 PM

## 2022-01-27 NOTE — Anesthesia Preprocedure Evaluation (Signed)
Anesthesia Evaluation  Patient identified by MRN, date of birth, ID band Patient awake    Reviewed: Allergy & Precautions, H&P , NPO status , Patient's Chart, lab work & pertinent test results  History of Anesthesia Complications Negative for: history of anesthetic complications  Airway Mallampati: II  TM Distance: >3 FB     Dental   Pulmonary neg pulmonary ROS,    Pulmonary exam normal        Cardiovascular negative cardio ROS   Rhythm:regular Rate:Normal     Neuro/Psych negative neurological ROS  negative psych ROS   GI/Hepatic negative GI ROS, Neg liver ROS,   Endo/Other  Hypothyroidism   Renal/GU negative Renal ROS  negative genitourinary   Musculoskeletal   Abdominal   Peds  Hematology negative hematology ROS (+)   Anesthesia Other Findings   Reproductive/Obstetrics (+) Pregnancy                             Anesthesia Physical Anesthesia Plan  ASA: 2  Anesthesia Plan: Epidural   Post-op Pain Management:    Induction:   PONV Risk Score and Plan:   Airway Management Planned:   Additional Equipment:   Intra-op Plan:   Post-operative Plan:   Informed Consent: I have reviewed the patients History and Physical, chart, labs and discussed the procedure including the risks, benefits and alternatives for the proposed anesthesia with the patient or authorized representative who has indicated his/her understanding and acceptance.       Plan Discussed with:   Anesthesia Plan Comments:         Anesthesia Quick Evaluation

## 2022-01-28 ENCOUNTER — Inpatient Hospital Stay (HOSPITAL_COMMUNITY): Payer: Federal, State, Local not specified - PPO

## 2022-01-28 LAB — CBC
HCT: 32.3 % — ABNORMAL LOW (ref 36.0–46.0)
Hemoglobin: 11.7 g/dL — ABNORMAL LOW (ref 12.0–15.0)
MCH: 34.5 pg — ABNORMAL HIGH (ref 26.0–34.0)
MCHC: 36.2 g/dL — ABNORMAL HIGH (ref 30.0–36.0)
MCV: 95.3 fL (ref 80.0–100.0)
Platelets: 229 10*3/uL (ref 150–400)
RBC: 3.39 MIL/uL — ABNORMAL LOW (ref 3.87–5.11)
RDW: 11.9 % (ref 11.5–15.5)
WBC: 14.2 10*3/uL — ABNORMAL HIGH (ref 4.0–10.5)
nRBC: 0 % (ref 0.0–0.2)

## 2022-01-28 MED ORDER — RHO D IMMUNE GLOBULIN 1500 UNIT/2ML IJ SOSY
300.0000 ug | PREFILLED_SYRINGE | Freq: Once | INTRAMUSCULAR | Status: AC
  Administered 2022-01-28: 300 ug via INTRAVENOUS
  Filled 2022-01-28: qty 2

## 2022-01-28 NOTE — Lactation Note (Signed)
This note was copied from a baby's chart. Lactation Consultation Note  Patient Name: Jasmine Moreno [id number] Date: 01/28/2022 Reason for consult: Initial assessment;Primapara;Infant < 6lbs;Early term 37-38.6wks;Maternal endocrine disorder Age:37 hours  Baby w/low glucose X2. RN gave glucose. Baby is spitting up. Mom stated baby BF well. Gave LPI information sheet. Instructed about time of BF and the need to supplement. Mom is BF/formula feeding on admission. Mom shown how to use DEBP & how to disassemble, clean, & reassemble parts. Mom knows to pump q3h for 15-20 min.  Mom pumped 5 ml colostrum. Praised mom.  Since baby is spitting up at this time, suggested to give colostrum after next feeding. Parents are tired and a lot of information is reviewed. Information will probably need to be reviewed again. LPI newborn behavior, feeding habits, STS, importance of I&O, supply and demand, milk storage reviewed. Encouraged to call for assistance or questions.  Maternal Data Does the patient have breastfeeding experience prior to this delivery?: No  Feeding    LATCH Score Latch: Repeated attempts needed to sustain latch, nipple held in mouth throughout feeding, stimulation needed to elicit sucking reflex.  Audible Swallowing: None  Type of Nipple: Everted at rest and after stimulation  Comfort (Breast/Nipple): Soft / non-tender  Hold (Positioning): Assistance needed to correctly position infant at breast and maintain latch.  LATCH Score: 6   Lactation Tools Discussed/Used Tools: Pump;Flanges Flange Size: 21 Breast pump type: Double-Electric Breast Pump Pump Education: Setup, frequency, and cleaning;Milk Storage Reason for Pumping: less than 5 lbs Pumping frequency: q3hr Pumped volume: 5 mL  Interventions Interventions: DEBP;LC Services brochure;LPT handout/interventions  Discharge    Consult Status Consult Status: Follow-up Date: 01/28/22 Follow-up type:  In-patient    Jasmine Moreno, [name] 01/28/2022, 2:45 AM

## 2022-01-28 NOTE — Progress Notes (Signed)
       PPD # 1 S/P NSVD  Live born female  Birth Weight: 4 lb 13 oz (2183 g) APGAR: 8, 9  Newborn Delivery   Birth date/time: 01/27/2022 20:41:00 Delivery type: Vaginal, Spontaneous     Baby name: Kerry Dory Delivering provider: LAW, CASSANDRA A  Episiotomy:None   Lacerations:2nd degree   circumcision planned  Feeding: breast and bottle  Pain control at delivery: Epidural   S:  Reports feeling well but sore             Tolerating po/ No nausea or vomiting             Bleeding is moderate             Pain controlled with acetaminophen and ibuprofen (OTC)             Up ad lib / ambulatory / voiding without difficulties   O:  A & O x 3, in no apparent distress              VS:  Vitals:   01/27/22 2201 01/27/22 2225 01/27/22 2340 01/28/22 0430  BP: (!) 144/85 136/85 134/83 132/81  Pulse: 69 71 77 79  Resp:  16 18 18   Temp:  98.5 F (36.9 C) 98.1 F (36.7 C) 98 F (36.7 C)  TempSrc:  Oral Oral Oral  SpO2:  100% 100% 100%  Weight:      Height:        LABS:  Recent Labs    01/27/22 0714 01/28/22 0343  WBC 10.1 14.2*  HGB 12.6 11.7*  HCT 35.4* 32.3*  PLT 243 229    Blood type: --/--/O NEG (09/27 0714)  Rubella: Immune (03/24 0000)   I&O: I/O last 3 completed shifts: In: 0  Out: 440 [Urine:400; Blood:40]          No intake/output data recorded.  Vaccines: TDaP          UTD   Gen: AAO x 3, NAD  Abdomen: soft, non-tender, non-distended             Fundus: firm, non-tender, U-1  Perineum: deferred per patient request  Lochia: small  Extremities: no edema, no calf pain or tenderness    A/P: PPD # 1 36 y.o., G1P1001   Principal Problem:   Postpartum care following vaginal delivery 9/27 Active Problems:   Encounter for induction of labor   SVD (spontaneous vaginal delivery)   Perineal laceration, second degree   Doing well - stable status  Routine post partum orders  Anticipate discharge tomorrow    Juliene Pina, MSN, CNM 01/28/2022, 8:19 AM

## 2022-01-28 NOTE — Lactation Note (Signed)
This note was copied from a baby's chart. Lactation Consultation Note  Patient Name: Jasmine Moreno [id number] Date: 01/28/2022 Reason for consult: Follow-up assessment;Early term 37-38.6wks;Infant < 6lbs Age:37 hours  Parent pumping when LC entered the room.  Parents feel the bottle feeding is improving a little.   Mom denies questions at this time.   LC encouraged follow up with OP LC support after DC.  Reviewed resource sheet with family.  Parent are aware of OP LC Sharon Hice at [employer] and [facility] as well as phone line.    Maternal Data    Feeding Mother's Current Feeding Choice: Breast Milk and Donor Milk (forified donor milk) Nipple Type: Extra Slow Flow  LATCH Score                    Lactation Tools Discussed/Used Tools: Pump Breast pump type: Double-Electric Breast Pump Pump Education: Setup, frequency, and cleaning Reason for Pumping: stimulate supply Pumping frequency: encouraged every 3 hours Pumped volume: 5 mL  Interventions    Discharge    Consult Status Consult Status: Follow-up Date: 01/29/22 Follow-up type: In-patient    Ferne Coe [facility] 01/28/2022, 11:44 PM

## 2022-01-28 NOTE — Anesthesia Postprocedure Evaluation (Signed)
Anesthesia Post Note  Patient: Jasmine Moreno  Procedure(s) Performed: AN AD HOC LABOR EPIDURAL     Patient location during evaluation: Mother Baby Anesthesia Type: Epidural Level of consciousness: awake and alert Pain management: pain level controlled Vital Signs Assessment: post-procedure vital signs reviewed and stable Respiratory status: spontaneous breathing, nonlabored ventilation and respiratory function stable Cardiovascular status: stable Postop Assessment: no headache, no backache and epidural receding Anesthetic complications: no   No notable events documented.  Last Vitals:  Vitals:   01/28/22 0911 01/28/22 1305  BP: 133/75 127/71  Pulse: (!) 58 71  Resp: 18 18  Temp: 36.8 C 36.7 C  SpO2: 100% 99%    Last Pain:  Vitals:   01/28/22 1305  TempSrc: Oral  PainSc: 0-No pain   Pain Goal:                   Alvon Nygaard

## 2022-01-29 LAB — RH IG WORKUP (INCLUDES ABO/RH)
Fetal Screen: NEGATIVE
Gestational Age(Wks): 37.4
Unit division: 0

## 2022-01-29 MED ORDER — IBUPROFEN 600 MG PO TABS: 600.0000 mg | ORAL_TABLET | Freq: Four times a day (QID) | ORAL | 0 refills | Status: AC

## 2022-01-29 MED ORDER — ACETAMINOPHEN 325 MG PO TABS: 650.0000 mg | ORAL_TABLET | ORAL | Status: AC | PRN

## 2022-01-29 NOTE — Progress Notes (Signed)
       PPD # 2 S/P NSVD  Live born female  Birth Weight: 4 lb 13 oz (2183 g) APGAR: 8, 9  Newborn Delivery   Birth date/time: 01/27/2022 20:41:00 Delivery type: Vaginal, Spontaneous     Baby name: Jasmine Moreno Delivering provider: LAW, CASSANDRA A  Episiotomy:None   Lacerations:2nd degree   circumcision planned tomorrow- baby not cleared today   Feeding: breast and bottle- donor milk   Pain control at delivery: Epidural   S:  Reports feeling well but sore             Tolerating po/ No nausea or vomiting             Bleeding is moderate             Pain controlled with acetaminophen and ibuprofen (OTC)             Up ad lib / ambulatory / voiding without difficulties   O:  A & O x 3, in no apparent distress              VS:  Vitals:   01/28/22 0430 01/28/22 0911 01/28/22 1305 01/29/22 0556  BP: 132/81 133/75 127/71 125/77  Pulse: 79 (!) 58 71 (!) 59  Resp: 18 18 18 16   Temp: 98 F (36.7 C) 98.2 F (36.8 C) 98 F (36.7 C) 97.9 F (36.6 C)  TempSrc: Oral Oral Oral Oral  SpO2: 100% 100% 99% 99%  Weight:      Height:        LABS:  Recent Labs    01/27/22 0714 01/28/22 0343  WBC 10.1 14.2*  HGB 12.6 11.7*  HCT 35.4* 32.3*  PLT 243 229     Blood type: --/--/O NEG (09/27 0714)  s/p Rhogam   Rubella: Immune (03/24 0000)   I&O: I/O last 3 completed shifts: In: 0  Out: 440 [Urine:400; Blood:40]          No intake/output data recorded.  Vaccines: TDaP          UTD   Gen: AAO x 3, NAD  Abdomen: soft, non-tender, non-distended             Fundus: firm, non-tender, U-1  Perineum: deferred per patient request  Lochia: small  Extremities: no edema, no calf pain or tenderness    A/P: PPD # 2 36 y.o., G1P1001   Principal Problem:   Postpartum care following vaginal delivery 9/27 Active Problems:   Encounter for induction of labor   SVD (spontaneous vaginal delivery)   Perineal laceration, second degree   Routine post partum orders, Discharge now but will stay  back as baby patient due to IUGR and weight loss and feeding issues   PP care and warning ss dw pt. F/up Dr Lanny Cramp in 6 wks     Elveria Royals, MD 01/29/2022, 12:24 PM

## 2022-01-29 NOTE — Discharge Summary (Signed)
Postpartum Discharge Summary  Date of Service updated 01/29/22     Patient Name: Jasmine Moreno DOB: 05-26-1985 MRN: [id number]  Date of admission: 01/27/2022 Delivery date:01/27/2022  Delivering provider: Langley Gauss A  Date of discharge: 01/29/2022  Admitting diagnosis: Encounter for induction of labor [Z34.90] for IUGR at 2% growth, AMA, Velamentous cord, Rh Negative Intrauterine pregnancy: [redacted]w[redacted]d     Secondary diagnosis:  Principal Problem:   Postpartum care following vaginal delivery 9/27 Active Problems:   Encounter for induction of labor   SVD (spontaneous vaginal delivery)   Perineal laceration, second degree     Discharge diagnosis: Term Pregnancy Delivered , SGA baby                                         Post partum procedures:rhogam Augmentation: AROM, Pitocin, Cytotec, and IP Foley Complications: None  Hospital course: Induction of Labor With Vaginal Delivery   36 y.o. yo G1P1001 at [redacted]w[redacted]d was admitted to the hospital 01/27/2022 for induction of labor.  Indication for induction:  severe FGR  .  Patient had an uncomplicated labor course as follows: Membrane Rupture Time/Date: 5:43 PM ,01/27/2022   Delivery Method:Vaginal, Spontaneous  Episiotomy: None  Lacerations:  2nd degree  Details of delivery can be found in separate delivery note.  Patient had a routine postpartum course. Patient is discharged home 01/29/22.  Newborn Data: Birth date:01/27/2022  Birth time:8:41 PM  Gender:Female  Living status:Living  Apgars:8 ,9  [phone] g   Magnesium Sulfate received: No BMZ received: No Rhophylac:Yes MMR:No T-DaP:Given prenatally Flu: No Transfusion:No  Physical exam  Vitals:   01/28/22 0430 01/28/22 0911 01/28/22 1305 01/29/22 0556  BP: 132/81 133/75 127/71 125/77  Pulse: 79 (!) 58 71 (!) 59  Resp: $Remo'18 18 18 16  'uvdCx$ Temp: 98 F (36.7 C) 98.2 F (36.8 C) 98 F (36.7 C) 97.9 F (36.6 C)  TempSrc: Oral Oral Oral Oral  SpO2: 100% 100% 99% 99%  Weight:       Height:       General: alert, cooperative, and no distress Lochia: appropriate Uterine Fundus: firm Incision: Healing well with no significant drainage DVT Evaluation: No evidence of DVT seen on physical exam. Labs: Lab Results  Component Value Date   WBC 14.2 (H) 01/28/2022   HGB 11.7 (L) 01/28/2022   HCT 32.3 (L) 01/28/2022   MCV 95.3 01/28/2022   PLT 229 01/28/2022       No data to display         Edinburgh Score:    01/28/2022    9:11 AM  Edinburgh Postnatal Depression Scale Screening Tool  I have been able to laugh and see the funny side of things. 0  I have looked forward with enjoyment to things. 0  I have blamed myself unnecessarily when things went wrong. 1  I have been anxious or worried for no good reason. 1  I have felt scared or panicky for no good reason. 0  Things have been getting on top of me. 0  I have been so unhappy that I have had difficulty sleeping. 0  I have felt sad or miserable. 0  I have been so unhappy that I have been crying. 0  The thought of harming myself has occurred to me. 0  Edinburgh Postnatal Depression Scale Total 2      After visit meds:  Allergies  as of 01/29/2022       Reactions   Other    Cilantro vomitting   Penicillins Itching        Medication List     TAKE these medications    acetaminophen 325 MG tablet Commonly known as: Tylenol Take 2 tablets (650 mg total) by mouth every 4 (four) hours as needed (for pain scale < 4).   ibuprofen 600 MG tablet Commonly known as: ADVIL Take 1 tablet (600 mg total) by mouth every 6 (six) hours.   levothyroxine 75 MCG tablet Commonly known as: SYNTHROID Take 75 mcg by mouth daily before breakfast.   multivitamin-prenatal 27-0.8 MG Tabs tablet Take 1 tablet by mouth daily at 12 noon.   VITAMIN D PO Take by mouth.         Discharge home in stable condition Infant Feeding: Breast Infant Disposition:rooming in. Baby needs to stay due to weight loss  Discharge  instruction: per After Visit Summary and Postpartum booklet. Activity: Advance as tolerated. Pelvic rest for 6 weeks.  Diet: routine diet Anticipated Birth Control: Unsure Postpartum Appointment:6 weeks Additional Postpartum F/U:  as needed  Future Appointments:No future appointments. Follow up Visit:  Follow-up Information     Law, Cassandra A, DO Follow up in 6 week(s).   Specialty: Obstetrics and Gynecology Contact information: [address] [id number] [phone]                     01/29/2022 Elveria Royals, MD

## 2022-01-30 DIAGNOSIS — Z412 Encounter for routine and ritual male circumcision: Secondary | ICD-10-CM | POA: Diagnosis not present

## 2022-02-01 LAB — SURGICAL PATHOLOGY

## 2022-02-04 ENCOUNTER — Telehealth (HOSPITAL_COMMUNITY): Payer: Self-pay | Admitting: *Deleted

## 2022-02-04 NOTE — Telephone Encounter (Signed)
Hospital Discharge Follow-Up Call:  Patient reports that she is well and has no concerns about her healing process.  EPDS today was 0 and she endorses this accurately reflects that she is doing well emotionally.  Patient says that baby is well and she has no concerns about baby's health.  She reports that baby sleeps in bassinet in parent's bedroom.  Reviewed ABCs of Safe Sleep.

## 2022-02-18 DIAGNOSIS — M9902 Segmental and somatic dysfunction of thoracic region: Secondary | ICD-10-CM | POA: Diagnosis not present

## 2022-02-18 DIAGNOSIS — M62830 Muscle spasm of back: Secondary | ICD-10-CM | POA: Diagnosis not present

## 2022-02-18 DIAGNOSIS — M545 Low back pain, unspecified: Secondary | ICD-10-CM | POA: Diagnosis not present

## 2022-02-18 DIAGNOSIS — M9903 Segmental and somatic dysfunction of lumbar region: Secondary | ICD-10-CM | POA: Diagnosis not present

## 2022-03-08 DIAGNOSIS — E039 Hypothyroidism, unspecified: Secondary | ICD-10-CM | POA: Diagnosis not present

## 2022-03-08 DIAGNOSIS — Z309 Encounter for contraceptive management, unspecified: Secondary | ICD-10-CM | POA: Diagnosis not present

## 2022-03-18 DIAGNOSIS — M9903 Segmental and somatic dysfunction of lumbar region: Secondary | ICD-10-CM | POA: Diagnosis not present

## 2022-03-18 DIAGNOSIS — M62830 Muscle spasm of back: Secondary | ICD-10-CM | POA: Diagnosis not present

## 2022-03-18 DIAGNOSIS — M9902 Segmental and somatic dysfunction of thoracic region: Secondary | ICD-10-CM | POA: Diagnosis not present

## 2022-03-18 DIAGNOSIS — M545 Low back pain, unspecified: Secondary | ICD-10-CM | POA: Diagnosis not present

## 2022-04-01 DIAGNOSIS — M47816 Spondylosis without myelopathy or radiculopathy, lumbar region: Secondary | ICD-10-CM | POA: Diagnosis not present

## 2022-05-20 DIAGNOSIS — M9903 Segmental and somatic dysfunction of lumbar region: Secondary | ICD-10-CM | POA: Diagnosis not present

## 2022-05-20 DIAGNOSIS — M545 Low back pain, unspecified: Secondary | ICD-10-CM | POA: Diagnosis not present

## 2022-05-20 DIAGNOSIS — M62830 Muscle spasm of back: Secondary | ICD-10-CM | POA: Diagnosis not present

## 2022-05-20 DIAGNOSIS — M9901 Segmental and somatic dysfunction of cervical region: Secondary | ICD-10-CM | POA: Diagnosis not present

## 2022-05-25 DIAGNOSIS — J310 Chronic rhinitis: Secondary | ICD-10-CM | POA: Diagnosis not present

## 2022-06-17 DIAGNOSIS — M62830 Muscle spasm of back: Secondary | ICD-10-CM | POA: Diagnosis not present

## 2022-06-17 DIAGNOSIS — M545 Low back pain, unspecified: Secondary | ICD-10-CM | POA: Diagnosis not present

## 2022-06-17 DIAGNOSIS — M9901 Segmental and somatic dysfunction of cervical region: Secondary | ICD-10-CM | POA: Diagnosis not present

## 2022-06-17 DIAGNOSIS — M9903 Segmental and somatic dysfunction of lumbar region: Secondary | ICD-10-CM | POA: Diagnosis not present

## 2022-06-29 DIAGNOSIS — M47816 Spondylosis without myelopathy or radiculopathy, lumbar region: Secondary | ICD-10-CM | POA: Diagnosis not present

## 2022-07-15 DIAGNOSIS — M9901 Segmental and somatic dysfunction of cervical region: Secondary | ICD-10-CM | POA: Diagnosis not present

## 2022-07-15 DIAGNOSIS — M545 Low back pain, unspecified: Secondary | ICD-10-CM | POA: Diagnosis not present

## 2022-07-15 DIAGNOSIS — M62830 Muscle spasm of back: Secondary | ICD-10-CM | POA: Diagnosis not present

## 2022-07-15 DIAGNOSIS — M9903 Segmental and somatic dysfunction of lumbar region: Secondary | ICD-10-CM | POA: Diagnosis not present

## 2022-07-26 DIAGNOSIS — M47816 Spondylosis without myelopathy or radiculopathy, lumbar region: Secondary | ICD-10-CM | POA: Diagnosis not present

## 2022-07-26 DIAGNOSIS — Z6827 Body mass index (BMI) 27.0-27.9, adult: Secondary | ICD-10-CM | POA: Diagnosis not present

## 2022-08-19 DIAGNOSIS — M545 Low back pain, unspecified: Secondary | ICD-10-CM | POA: Diagnosis not present

## 2022-08-19 DIAGNOSIS — M9903 Segmental and somatic dysfunction of lumbar region: Secondary | ICD-10-CM | POA: Diagnosis not present

## 2022-08-19 DIAGNOSIS — M62830 Muscle spasm of back: Secondary | ICD-10-CM | POA: Diagnosis not present

## 2022-08-19 DIAGNOSIS — M9901 Segmental and somatic dysfunction of cervical region: Secondary | ICD-10-CM | POA: Diagnosis not present

## 2022-09-16 DIAGNOSIS — M62830 Muscle spasm of back: Secondary | ICD-10-CM | POA: Diagnosis not present

## 2022-09-16 DIAGNOSIS — M545 Low back pain, unspecified: Secondary | ICD-10-CM | POA: Diagnosis not present

## 2022-09-16 DIAGNOSIS — M9903 Segmental and somatic dysfunction of lumbar region: Secondary | ICD-10-CM | POA: Diagnosis not present

## 2022-09-16 DIAGNOSIS — M9901 Segmental and somatic dysfunction of cervical region: Secondary | ICD-10-CM | POA: Diagnosis not present

## 2022-10-18 DIAGNOSIS — M9903 Segmental and somatic dysfunction of lumbar region: Secondary | ICD-10-CM | POA: Diagnosis not present

## 2022-10-18 DIAGNOSIS — M9901 Segmental and somatic dysfunction of cervical region: Secondary | ICD-10-CM | POA: Diagnosis not present

## 2022-10-18 DIAGNOSIS — M545 Low back pain, unspecified: Secondary | ICD-10-CM | POA: Diagnosis not present

## 2022-10-18 DIAGNOSIS — M62830 Muscle spasm of back: Secondary | ICD-10-CM | POA: Diagnosis not present

## 2022-11-18 DIAGNOSIS — M62830 Muscle spasm of back: Secondary | ICD-10-CM | POA: Diagnosis not present

## 2022-11-18 DIAGNOSIS — M9901 Segmental and somatic dysfunction of cervical region: Secondary | ICD-10-CM | POA: Diagnosis not present

## 2022-11-18 DIAGNOSIS — M545 Low back pain, unspecified: Secondary | ICD-10-CM | POA: Diagnosis not present

## 2022-11-18 DIAGNOSIS — M9903 Segmental and somatic dysfunction of lumbar region: Secondary | ICD-10-CM | POA: Diagnosis not present

## 2022-12-16 DIAGNOSIS — M9901 Segmental and somatic dysfunction of cervical region: Secondary | ICD-10-CM | POA: Diagnosis not present

## 2022-12-16 DIAGNOSIS — M62830 Muscle spasm of back: Secondary | ICD-10-CM | POA: Diagnosis not present

## 2022-12-16 DIAGNOSIS — M9903 Segmental and somatic dysfunction of lumbar region: Secondary | ICD-10-CM | POA: Diagnosis not present

## 2022-12-16 DIAGNOSIS — M545 Low back pain, unspecified: Secondary | ICD-10-CM | POA: Diagnosis not present

## 2022-12-29 DIAGNOSIS — M47816 Spondylosis without myelopathy or radiculopathy, lumbar region: Secondary | ICD-10-CM | POA: Diagnosis not present

## 2023-01-13 DIAGNOSIS — M62830 Muscle spasm of back: Secondary | ICD-10-CM | POA: Diagnosis not present

## 2023-01-13 DIAGNOSIS — M9901 Segmental and somatic dysfunction of cervical region: Secondary | ICD-10-CM | POA: Diagnosis not present

## 2023-01-13 DIAGNOSIS — M9903 Segmental and somatic dysfunction of lumbar region: Secondary | ICD-10-CM | POA: Diagnosis not present

## 2023-01-13 DIAGNOSIS — M545 Low back pain, unspecified: Secondary | ICD-10-CM | POA: Diagnosis not present

## 2023-02-10 DIAGNOSIS — M545 Low back pain, unspecified: Secondary | ICD-10-CM | POA: Diagnosis not present

## 2023-02-10 DIAGNOSIS — M9903 Segmental and somatic dysfunction of lumbar region: Secondary | ICD-10-CM | POA: Diagnosis not present

## 2023-02-10 DIAGNOSIS — M62830 Muscle spasm of back: Secondary | ICD-10-CM | POA: Diagnosis not present

## 2023-02-10 DIAGNOSIS — M9901 Segmental and somatic dysfunction of cervical region: Secondary | ICD-10-CM | POA: Diagnosis not present

## 2023-03-10 DIAGNOSIS — M62830 Muscle spasm of back: Secondary | ICD-10-CM | POA: Diagnosis not present

## 2023-03-10 DIAGNOSIS — M9903 Segmental and somatic dysfunction of lumbar region: Secondary | ICD-10-CM | POA: Diagnosis not present

## 2023-03-10 DIAGNOSIS — M9901 Segmental and somatic dysfunction of cervical region: Secondary | ICD-10-CM | POA: Diagnosis not present

## 2023-03-10 DIAGNOSIS — M545 Low back pain, unspecified: Secondary | ICD-10-CM | POA: Diagnosis not present

## 2023-03-14 DIAGNOSIS — Z01419 Encounter for gynecological examination (general) (routine) without abnormal findings: Secondary | ICD-10-CM | POA: Diagnosis not present

## 2023-03-14 DIAGNOSIS — Z1331 Encounter for screening for depression: Secondary | ICD-10-CM | POA: Diagnosis not present

## 2023-04-04 DIAGNOSIS — M47816 Spondylosis without myelopathy or radiculopathy, lumbar region: Secondary | ICD-10-CM | POA: Diagnosis not present

## 2023-04-14 DIAGNOSIS — M9903 Segmental and somatic dysfunction of lumbar region: Secondary | ICD-10-CM | POA: Diagnosis not present

## 2023-04-14 DIAGNOSIS — M9901 Segmental and somatic dysfunction of cervical region: Secondary | ICD-10-CM | POA: Diagnosis not present

## 2023-04-14 DIAGNOSIS — M545 Low back pain, unspecified: Secondary | ICD-10-CM | POA: Diagnosis not present

## 2023-04-14 DIAGNOSIS — M62830 Muscle spasm of back: Secondary | ICD-10-CM | POA: Diagnosis not present

## 2023-04-21 DIAGNOSIS — M47816 Spondylosis without myelopathy or radiculopathy, lumbar region: Secondary | ICD-10-CM | POA: Diagnosis not present

## 2023-04-21 DIAGNOSIS — M5127 Other intervertebral disc displacement, lumbosacral region: Secondary | ICD-10-CM | POA: Diagnosis not present

## 2023-04-21 DIAGNOSIS — M2548 Effusion, other site: Secondary | ICD-10-CM | POA: Diagnosis not present

## 2023-05-17 DIAGNOSIS — M545 Low back pain, unspecified: Secondary | ICD-10-CM | POA: Diagnosis not present

## 2023-05-17 DIAGNOSIS — M9903 Segmental and somatic dysfunction of lumbar region: Secondary | ICD-10-CM | POA: Diagnosis not present

## 2023-05-17 DIAGNOSIS — M9901 Segmental and somatic dysfunction of cervical region: Secondary | ICD-10-CM | POA: Diagnosis not present

## 2023-05-17 DIAGNOSIS — M62830 Muscle spasm of back: Secondary | ICD-10-CM | POA: Diagnosis not present

## 2023-06-16 DIAGNOSIS — M9901 Segmental and somatic dysfunction of cervical region: Secondary | ICD-10-CM | POA: Diagnosis not present

## 2023-06-16 DIAGNOSIS — M9903 Segmental and somatic dysfunction of lumbar region: Secondary | ICD-10-CM | POA: Diagnosis not present

## 2023-06-16 DIAGNOSIS — M545 Low back pain, unspecified: Secondary | ICD-10-CM | POA: Diagnosis not present

## 2023-06-16 DIAGNOSIS — M62830 Muscle spasm of back: Secondary | ICD-10-CM | POA: Diagnosis not present

## 2023-07-05 DIAGNOSIS — M542 Cervicalgia: Secondary | ICD-10-CM | POA: Diagnosis not present

## 2023-07-05 DIAGNOSIS — M9903 Segmental and somatic dysfunction of lumbar region: Secondary | ICD-10-CM | POA: Diagnosis not present

## 2023-07-05 DIAGNOSIS — M545 Low back pain, unspecified: Secondary | ICD-10-CM | POA: Diagnosis not present

## 2023-07-05 DIAGNOSIS — M9901 Segmental and somatic dysfunction of cervical region: Secondary | ICD-10-CM | POA: Diagnosis not present

## 2023-08-01 DIAGNOSIS — Z6825 Body mass index (BMI) 25.0-25.9, adult: Secondary | ICD-10-CM | POA: Diagnosis not present

## 2023-08-10 IMAGING — MR MR LUMBAR SPINE WO/W CM
4 of 7 series · 28 of 48 positions shown · IV contrast (multihance)
Comparison: MRI of the lumbar spine 03/11/2020 at [REDACTED]

CLINICAL DATA: Low back pain extending into the lower extremities
bilaterally. Progression over 10 years. Discectomy 04/30/2020

EXAM:
MRI LUMBAR SPINE WITHOUT AND WITH CONTRAST
TECHNIQUE: Multiplanar and multiecho pulse sequences of the lumbar spine were
obtained without and with intravenous contrast.
CONTRAST:  15mL MULTIHANCE GADOBENATE DIMEGLUMINE 529 MG/ML IV SOLN

[Series 4: T1 · sagittal · 4.0mm · 0.59mm/px · 4 of 17 slices shown (1 of 2)]
[im 1/17]
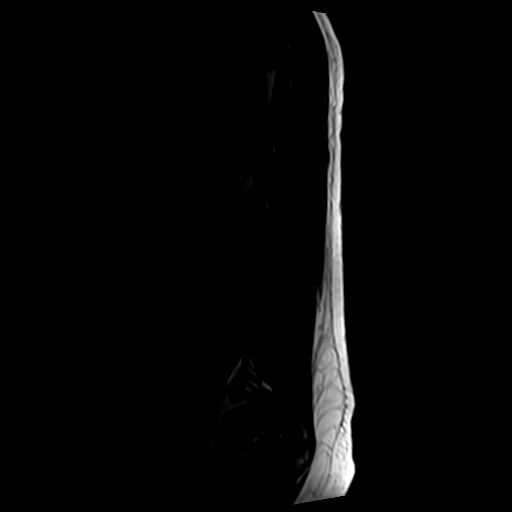
[im 6/17]
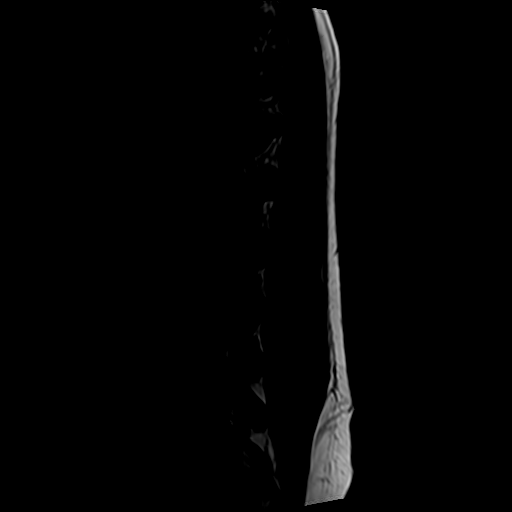
[im 11/17]
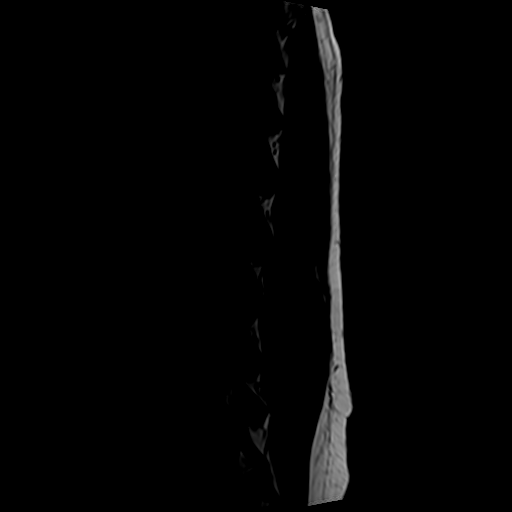
[im 17/17]
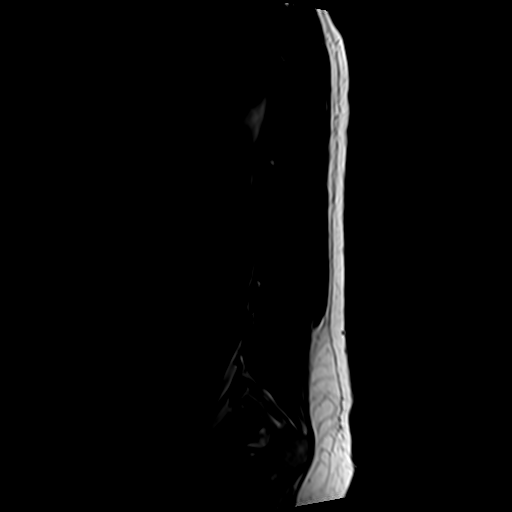

[Series 5: T2 · axial · 4.0mm · 0.70mm/px · z∈[-160,+70]mm · 11 of 45 slices shown (1 of 2)]
[im 1/45]
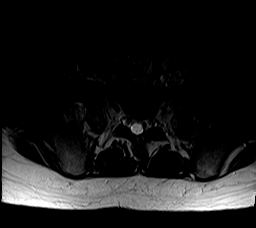
[im 5/45]
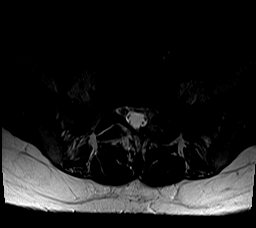
[im 9/45]
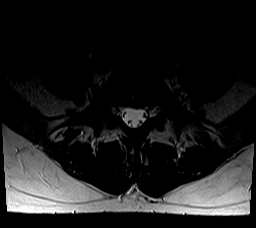
[im 14/45]
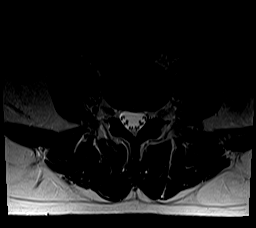
[im 18/45]
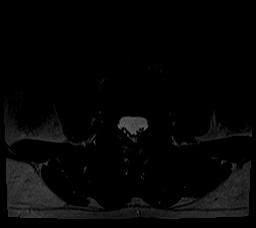
[im 23/45]
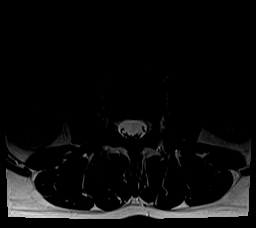
[im 27/45]
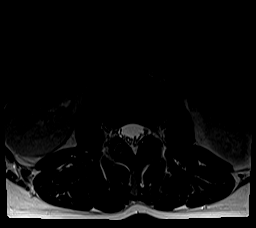
[im 31/45]
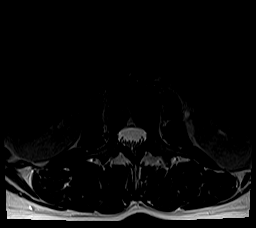
[im 36/45]
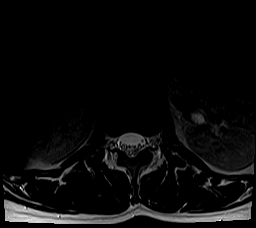
[im 40/45]
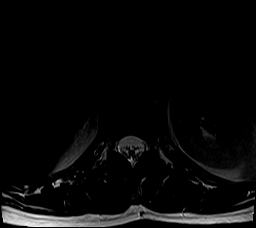
[im 45/45]
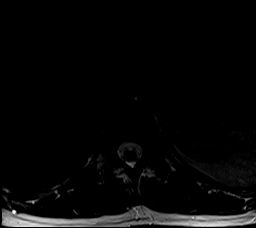

[Series 6: T1 · axial · 4.0mm · 0.35mm/px · z∈[-160,+44]mm · 9 of 45 slices shown (2 of 2)]
[im 1/45]
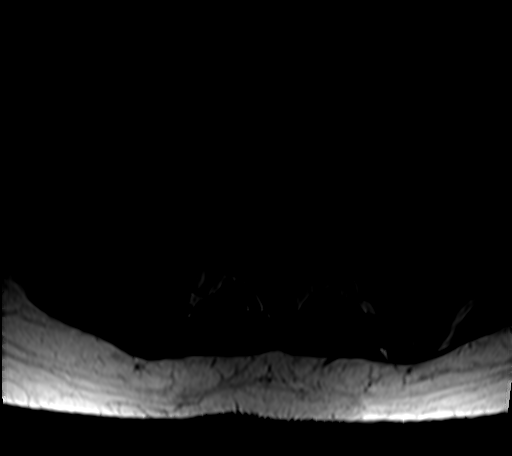
[im 5/45]
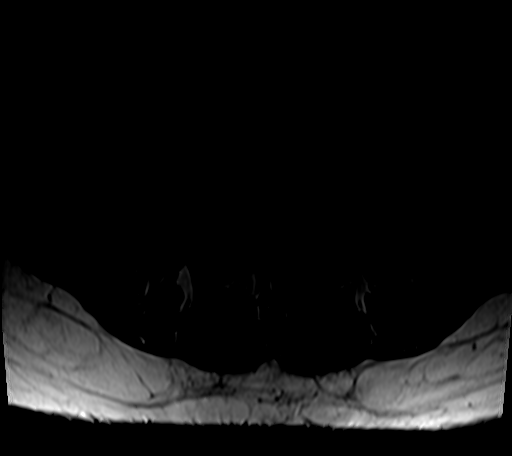
[im 9/45]
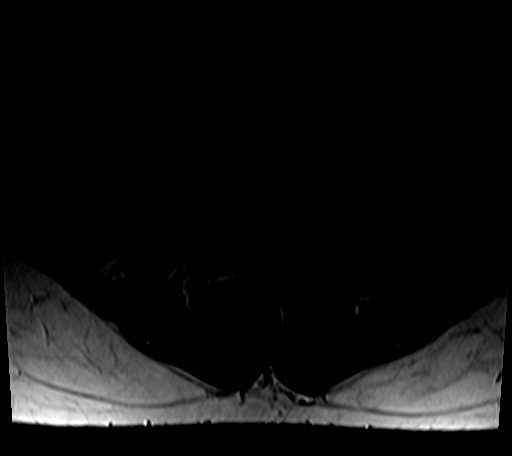
[im 14/45]
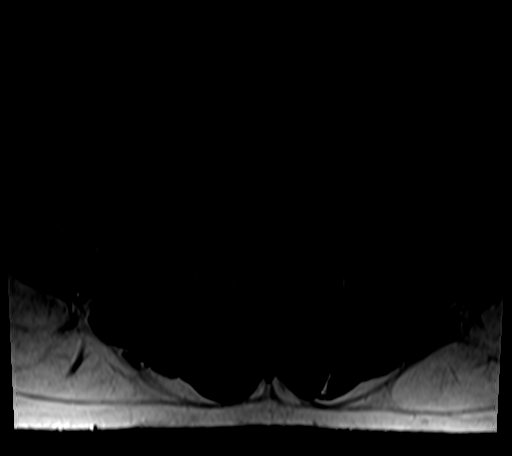
[im 18/45]
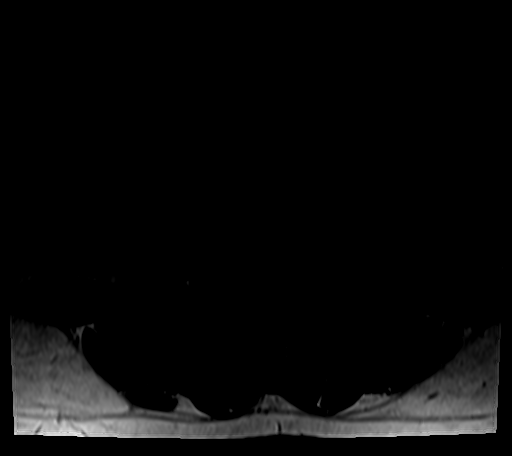
[im 23/45]
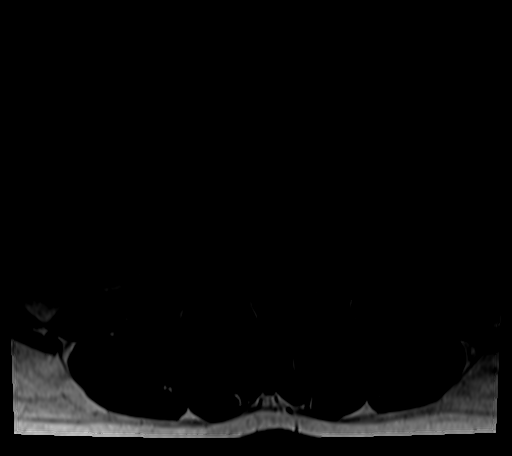
[im 27/45]
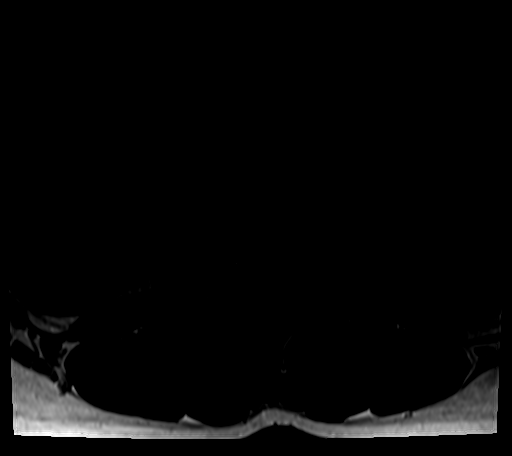
[im 31/45]
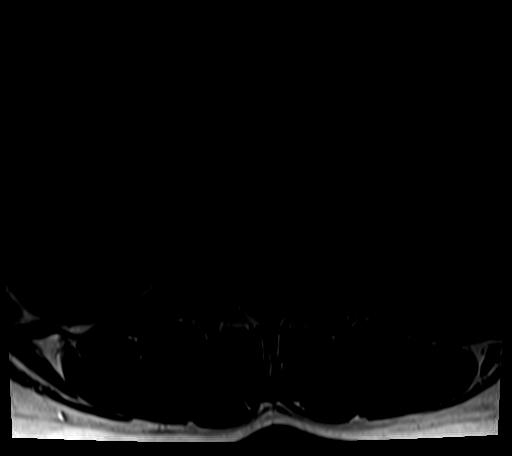
[im 40/45]
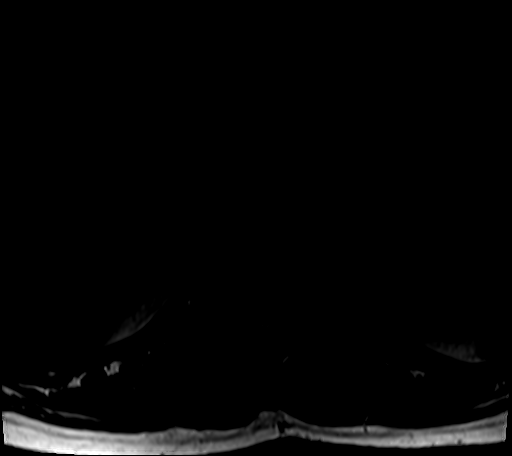

[Series 8: T2 · sagittal · 4.0mm · 0.59mm/px · 4 of 17 slices shown (2 of 2)]
[im 1/17]
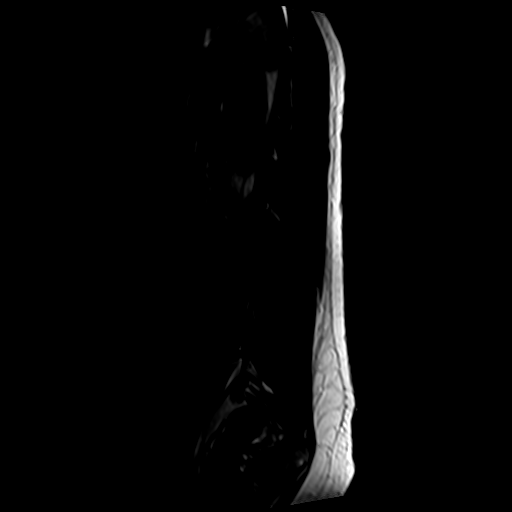
[im 6/17]
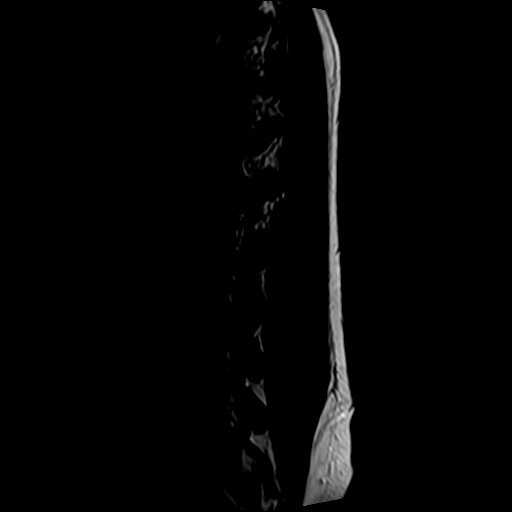
[im 11/17]
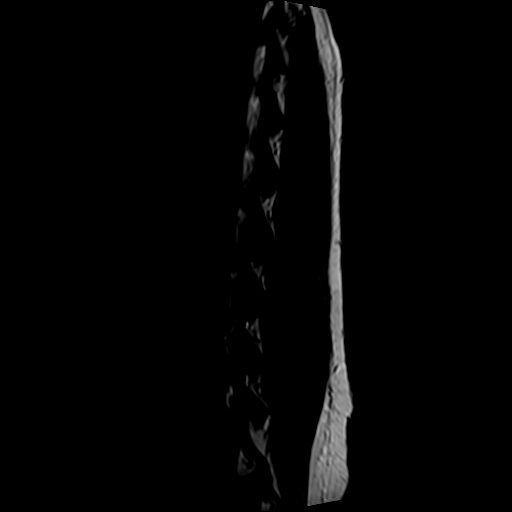
[im 17/17]
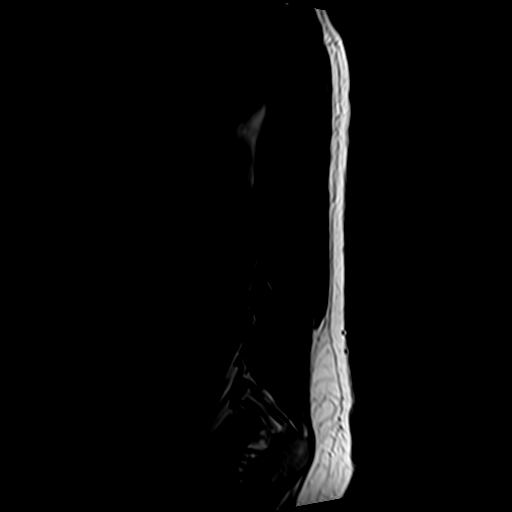

[28 of 48 positions shown; findings below may reference images not displayed]

FINDINGS: Segmentation: 5 non rib-bearing lumbar type vertebral bodies are
present. The lowest fully formed vertebral body is L5.

Alignment: Slight degenerative retrolisthesis at L4-5 and L5-S1 is
stable.

Vertebrae: Mild mixed edematous and fatty endplate marrow changes
are present at L5-S1. Marrow signal and vertebral body heights are
otherwise normal.

Conus medullaris and cauda equina: Conus extends to the L1 level.
Conus and cauda equina appear normal.

Paraspinal and other soft tissues: Limited imaging the abdomen is
unremarkable. There is no significant adenopathy. No solid organ
lesions are present.

Disc levels:

L1-2: Negative.

L2-3: Negative.

L3-4: Negative.

L4-5: Shallow leftward disc protrusion is present. No significant
stenosis is present.

L5-S1: No residual or recurrent stenosis is present. Enhancing
granulation tissue noted in the left subarticular space. Foramina
are patent bilaterally.

No pathologic enhancement is present.
IMPRESSION: 1. Postoperative changes at L5-S1 without residual or recurrent disc
protrusion or stenosis.
2. Shallow leftward disc protrusion at L4-5 without significant
stenosis is stable.

## 2023-08-16 DIAGNOSIS — M542 Cervicalgia: Secondary | ICD-10-CM | POA: Diagnosis not present

## 2023-08-16 DIAGNOSIS — M545 Low back pain, unspecified: Secondary | ICD-10-CM | POA: Diagnosis not present

## 2023-08-16 DIAGNOSIS — M9903 Segmental and somatic dysfunction of lumbar region: Secondary | ICD-10-CM | POA: Diagnosis not present

## 2023-08-16 DIAGNOSIS — M9901 Segmental and somatic dysfunction of cervical region: Secondary | ICD-10-CM | POA: Diagnosis not present

## 2023-09-13 DIAGNOSIS — M9903 Segmental and somatic dysfunction of lumbar region: Secondary | ICD-10-CM | POA: Diagnosis not present

## 2023-09-13 DIAGNOSIS — M9901 Segmental and somatic dysfunction of cervical region: Secondary | ICD-10-CM | POA: Diagnosis not present

## 2023-09-13 DIAGNOSIS — M545 Low back pain, unspecified: Secondary | ICD-10-CM | POA: Diagnosis not present

## 2023-09-13 DIAGNOSIS — M542 Cervicalgia: Secondary | ICD-10-CM | POA: Diagnosis not present

## 2023-10-18 DIAGNOSIS — M545 Low back pain, unspecified: Secondary | ICD-10-CM | POA: Diagnosis not present

## 2023-10-18 DIAGNOSIS — M542 Cervicalgia: Secondary | ICD-10-CM | POA: Diagnosis not present

## 2023-10-18 DIAGNOSIS — M9903 Segmental and somatic dysfunction of lumbar region: Secondary | ICD-10-CM | POA: Diagnosis not present

## 2023-10-18 DIAGNOSIS — M9901 Segmental and somatic dysfunction of cervical region: Secondary | ICD-10-CM | POA: Diagnosis not present

## 2023-11-22 DIAGNOSIS — M542 Cervicalgia: Secondary | ICD-10-CM | POA: Diagnosis not present

## 2023-11-22 DIAGNOSIS — M9901 Segmental and somatic dysfunction of cervical region: Secondary | ICD-10-CM | POA: Diagnosis not present

## 2023-11-22 DIAGNOSIS — M545 Low back pain, unspecified: Secondary | ICD-10-CM | POA: Diagnosis not present

## 2023-11-22 DIAGNOSIS — M9903 Segmental and somatic dysfunction of lumbar region: Secondary | ICD-10-CM | POA: Diagnosis not present

## 2023-12-27 DIAGNOSIS — M542 Cervicalgia: Secondary | ICD-10-CM | POA: Diagnosis not present

## 2023-12-27 DIAGNOSIS — M9901 Segmental and somatic dysfunction of cervical region: Secondary | ICD-10-CM | POA: Diagnosis not present

## 2023-12-27 DIAGNOSIS — M9903 Segmental and somatic dysfunction of lumbar region: Secondary | ICD-10-CM | POA: Diagnosis not present

## 2023-12-27 DIAGNOSIS — M545 Low back pain, unspecified: Secondary | ICD-10-CM | POA: Diagnosis not present

## 2024-01-24 DIAGNOSIS — M542 Cervicalgia: Secondary | ICD-10-CM | POA: Diagnosis not present

## 2024-01-24 DIAGNOSIS — M9901 Segmental and somatic dysfunction of cervical region: Secondary | ICD-10-CM | POA: Diagnosis not present

## 2024-01-24 DIAGNOSIS — M545 Low back pain, unspecified: Secondary | ICD-10-CM | POA: Diagnosis not present

## 2024-01-24 DIAGNOSIS — M9903 Segmental and somatic dysfunction of lumbar region: Secondary | ICD-10-CM | POA: Diagnosis not present

## 2024-02-21 DIAGNOSIS — M9901 Segmental and somatic dysfunction of cervical region: Secondary | ICD-10-CM | POA: Diagnosis not present

## 2024-02-21 DIAGNOSIS — M542 Cervicalgia: Secondary | ICD-10-CM | POA: Diagnosis not present

## 2024-02-21 DIAGNOSIS — M9903 Segmental and somatic dysfunction of lumbar region: Secondary | ICD-10-CM | POA: Diagnosis not present

## 2024-02-21 DIAGNOSIS — M545 Low back pain, unspecified: Secondary | ICD-10-CM | POA: Diagnosis not present

## 2024-03-20 DIAGNOSIS — M9903 Segmental and somatic dysfunction of lumbar region: Secondary | ICD-10-CM | POA: Diagnosis not present

## 2024-03-20 DIAGNOSIS — M9901 Segmental and somatic dysfunction of cervical region: Secondary | ICD-10-CM | POA: Diagnosis not present

## 2024-03-20 DIAGNOSIS — M545 Low back pain, unspecified: Secondary | ICD-10-CM | POA: Diagnosis not present

## 2024-03-20 DIAGNOSIS — M542 Cervicalgia: Secondary | ICD-10-CM | POA: Diagnosis not present

## 2024-04-17 DIAGNOSIS — M542 Cervicalgia: Secondary | ICD-10-CM | POA: Diagnosis not present

## 2024-04-17 DIAGNOSIS — M9901 Segmental and somatic dysfunction of cervical region: Secondary | ICD-10-CM | POA: Diagnosis not present

## 2024-04-17 DIAGNOSIS — M9903 Segmental and somatic dysfunction of lumbar region: Secondary | ICD-10-CM | POA: Diagnosis not present

## 2024-04-17 DIAGNOSIS — M545 Low back pain, unspecified: Secondary | ICD-10-CM | POA: Diagnosis not present
